# Patient Record
Sex: Female | Born: 1965
Health system: Southern US, Community
[De-identification: ages and names within clinical notes are randomized; demographics above are authoritative.]

## PROBLEM LIST (undated history)

## (undated) DIAGNOSIS — G43909 Migraine, unspecified, not intractable, without status migrainosus: Secondary | ICD-10-CM

## (undated) DIAGNOSIS — H04129 Dry eye syndrome of unspecified lacrimal gland: Secondary | ICD-10-CM

## (undated) DIAGNOSIS — M25561 Pain in right knee: Secondary | ICD-10-CM

## (undated) DIAGNOSIS — N879 Dysplasia of cervix uteri, unspecified: Secondary | ICD-10-CM

## (undated) DIAGNOSIS — M25562 Pain in left knee: Secondary | ICD-10-CM

## (undated) DIAGNOSIS — L719 Rosacea, unspecified: Secondary | ICD-10-CM

## (undated) DIAGNOSIS — R630 Anorexia: Secondary | ICD-10-CM

## (undated) HISTORY — DX: Dry eye syndrome of unspecified lacrimal gland: H04.129

## (undated) HISTORY — DX: Rosacea, unspecified: L71.9

## (undated) HISTORY — PX: KNEE SURGERY: SHX244

## (undated) HISTORY — PX: ROTATOR CUFF REPAIR: SHX139

## (undated) HISTORY — PX: DILATION AND CURETTAGE OF UTERUS: SHX78

## (undated) HISTORY — DX: Pain in left knee: M25.562

## (undated) HISTORY — PX: RHINOPLASTY: SUR1284

## (undated) HISTORY — DX: Pain in right knee: M25.561

## (undated) HISTORY — DX: Migraine, unspecified, not intractable, without status migrainosus: G43.909

## (undated) HISTORY — DX: Anorexia: R63.0

## (undated) HISTORY — PX: GYNECOLOGIC CRYOSURGERY: SHX857

## (undated) HISTORY — DX: Dysplasia of cervix uteri, unspecified: N87.9

---

## 1998-06-01 ENCOUNTER — Other Ambulatory Visit: Admission: RE | Admit: 1998-06-01 | Discharge: 1998-06-01 | Payer: Self-pay | Admitting: Gynecology

## 1999-07-29 ENCOUNTER — Other Ambulatory Visit: Admission: RE | Admit: 1999-07-29 | Discharge: 1999-07-29 | Payer: Self-pay | Admitting: Gynecology

## 1999-08-25 ENCOUNTER — Other Ambulatory Visit: Admission: RE | Admit: 1999-08-25 | Discharge: 1999-08-25 | Payer: Self-pay | Admitting: Gynecology

## 1999-09-09 HISTORY — PX: PELVIC LAPAROSCOPY: SHX162

## 1999-09-27 ENCOUNTER — Encounter (INDEPENDENT_AMBULATORY_CARE_PROVIDER_SITE_OTHER): Payer: Self-pay

## 1999-09-27 ENCOUNTER — Encounter (INDEPENDENT_AMBULATORY_CARE_PROVIDER_SITE_OTHER): Payer: Self-pay | Admitting: Specialist

## 1999-09-27 ENCOUNTER — Ambulatory Visit (HOSPITAL_COMMUNITY): Admission: RE | Admit: 1999-09-27 | Discharge: 1999-09-27 | Payer: Self-pay | Admitting: Gynecology

## 2000-08-03 ENCOUNTER — Other Ambulatory Visit: Admission: RE | Admit: 2000-08-03 | Discharge: 2000-08-03 | Payer: Self-pay | Admitting: Gynecology

## 2001-08-07 ENCOUNTER — Other Ambulatory Visit: Admission: RE | Admit: 2001-08-07 | Discharge: 2001-08-07 | Payer: Self-pay | Admitting: Gynecology

## 2003-07-01 ENCOUNTER — Encounter: Admission: RE | Admit: 2003-07-01 | Discharge: 2003-07-01 | Payer: Self-pay | Admitting: Family Medicine

## 2003-07-01 ENCOUNTER — Encounter: Payer: Self-pay | Admitting: Family Medicine

## 2003-08-24 ENCOUNTER — Other Ambulatory Visit: Admission: RE | Admit: 2003-08-24 | Discharge: 2003-08-24 | Payer: Self-pay | Admitting: Gynecology

## 2004-09-20 ENCOUNTER — Other Ambulatory Visit: Admission: RE | Admit: 2004-09-20 | Discharge: 2004-09-20 | Payer: Self-pay | Admitting: Gynecology

## 2005-09-22 ENCOUNTER — Other Ambulatory Visit: Admission: RE | Admit: 2005-09-22 | Discharge: 2005-09-22 | Payer: Self-pay | Admitting: Gynecology

## 2006-09-26 ENCOUNTER — Other Ambulatory Visit: Admission: RE | Admit: 2006-09-26 | Discharge: 2006-09-26 | Payer: Self-pay | Admitting: Gynecology

## 2007-08-07 ENCOUNTER — Ambulatory Visit (HOSPITAL_BASED_OUTPATIENT_CLINIC_OR_DEPARTMENT_OTHER): Admission: RE | Admit: 2007-08-07 | Discharge: 2007-08-07 | Payer: Self-pay | Admitting: Orthopedic Surgery

## 2007-10-09 ENCOUNTER — Other Ambulatory Visit: Admission: RE | Admit: 2007-10-09 | Discharge: 2007-10-09 | Payer: Self-pay | Admitting: Gynecology

## 2008-01-08 HISTORY — PX: HYSTEROSCOPY: SHX211

## 2008-01-10 ENCOUNTER — Encounter: Payer: Self-pay | Admitting: Gynecology

## 2008-01-10 ENCOUNTER — Ambulatory Visit (HOSPITAL_BASED_OUTPATIENT_CLINIC_OR_DEPARTMENT_OTHER): Admission: RE | Admit: 2008-01-10 | Discharge: 2008-01-10 | Payer: Self-pay | Admitting: Gynecology

## 2008-10-29 ENCOUNTER — Emergency Department (HOSPITAL_COMMUNITY): Admission: EM | Admit: 2008-10-29 | Discharge: 2008-10-29 | Payer: Self-pay | Admitting: Emergency Medicine

## 2008-12-07 ENCOUNTER — Ambulatory Visit: Payer: Self-pay | Admitting: Gynecology

## 2008-12-07 ENCOUNTER — Encounter: Payer: Self-pay | Admitting: Gynecology

## 2008-12-07 ENCOUNTER — Other Ambulatory Visit: Admission: RE | Admit: 2008-12-07 | Discharge: 2008-12-07 | Payer: Self-pay | Admitting: Gynecology

## 2009-09-22 ENCOUNTER — Ambulatory Visit: Payer: Self-pay | Admitting: Sports Medicine

## 2009-09-22 DIAGNOSIS — M25562 Pain in left knee: Secondary | ICD-10-CM

## 2009-09-22 DIAGNOSIS — M2170 Unequal limb length (acquired), unspecified site: Secondary | ICD-10-CM | POA: Insufficient documentation

## 2009-09-22 DIAGNOSIS — M25561 Pain in right knee: Secondary | ICD-10-CM | POA: Insufficient documentation

## 2009-09-22 DIAGNOSIS — M2140 Flat foot [pes planus] (acquired), unspecified foot: Secondary | ICD-10-CM | POA: Insufficient documentation

## 2009-10-20 ENCOUNTER — Ambulatory Visit: Payer: Self-pay | Admitting: Sports Medicine

## 2009-12-13 ENCOUNTER — Ambulatory Visit: Payer: Self-pay | Admitting: Gynecology

## 2009-12-13 ENCOUNTER — Other Ambulatory Visit: Admission: RE | Admit: 2009-12-13 | Discharge: 2009-12-13 | Payer: Self-pay | Admitting: Gynecology

## 2010-03-09 ENCOUNTER — Ambulatory Visit: Payer: Self-pay | Admitting: Sports Medicine

## 2010-03-23 ENCOUNTER — Encounter: Admission: RE | Admit: 2010-03-23 | Discharge: 2010-06-21 | Payer: Self-pay | Admitting: Sports Medicine

## 2010-03-23 ENCOUNTER — Encounter: Payer: Self-pay | Admitting: Sports Medicine

## 2010-04-22 ENCOUNTER — Encounter: Payer: Self-pay | Admitting: Sports Medicine

## 2010-05-02 ENCOUNTER — Ambulatory Visit: Payer: Self-pay | Admitting: Sports Medicine

## 2010-11-10 NOTE — Miscellaneous (Signed)
Summary: [facility]  [facility]   Imported By: Marily Memos 04/27/2010 10:18:51  _____________________________________________________________________  External Attachment:    Type:   Image     Comment:   External Document

## 2010-11-10 NOTE — Assessment & Plan Note (Signed)
Summary: 11:30 APPT - FU BURSITIS IN KNEES   Vital Signs:  Patient profile:   45 year old female BP sitting:   115 / 68  Vitals Entered By: Lillia Pauls CMA (March 09, 2010 12:03 PM)  History of Present Illness: Since 11/2008 has had recurrence of pes anserine bursitis bilat got somewhat better when we treated her w exercisews however when she got walking up to 15 mins then pain returns has done the exercises and these help uses a 3/4 orthotic - soft_ and that helps some as well  Allergies: No Known Drug Allergies  Physical Exam  General:  Well-developed,well-nourished,in no acute distress; alert,appropriate and cooperative throughout examination Msk:  TTP over pes anserine area bilat RT> LTMM good hip ROM now has norm strength of both hip abductors both sartorius MM are strong HS are strong slt weakness over RT adductor  gait looks normal for walking   Impression & Recommendations:  Problem # 1:  PES ANSERINUS TENDINITIS OR BURSITIS (ICD-726.61) with chronic problems will refer to PT  trial of inotophoresis give a good exerse program p evaluation  reck P 8 sessions  Problem # 2:  UNEQUAL LEG LENGTH (ICD-736.81) this is small difference  however she does have pronatedd gait without orthotics  will bring back for full length custom orthotics  Complete Medication List: 1)  Voltaren 1 % Gel (Diclofenac sodium) .... Apply 1 gram to the affected areas qid

## 2010-11-10 NOTE — Miscellaneous (Signed)
Summary: [facility]  [facility]   Imported By: Marily Memos 03/25/2010 10:12:49  _____________________________________________________________________  External Attachment:    Type:   Image     Comment:   External Document

## 2010-11-10 NOTE — Assessment & Plan Note (Signed)
Summary: FU KNEE ISSUES/MJD   Vital Signs:  Patient profile:   45 year old female BP sitting:   96 / 62  Vitals Entered By: Lillia Pauls CMA (October 20, 2009 8:47 AM)  History of Present Illness: Reports to f/u chronic bilateral pes anserine bursitis. Notes 50% improvement since LOV. Performing hip/sartorius exercises daily w/o difficulty. Notes increased strength. Applying voltaren gel as prescribed. No knee swelling/instability. Plans to start walking soon. No concerns.  Happy with progress.  Allergies: No Known Drug Allergies  Physical Exam  General:  Well-developed,well-nourished,in no acute distress; alert,appropriate and cooperative throughout examination Msk:  HIPS: FROM. Increased ER strength since LOV.  KNEES: Decreased pes bursa ttp bilaterally. Trace pes bursa swelling bilaterally. Increased sartorius strength. No discoloration.  GAIT: Pronation controlled with orthotics.   Impression & Recommendations:  Problem # 1:  PES ANSERINUS TENDINITIS OR BURSITIS (ICD-726.61)  - Voltaren as needed. - Continue daily exercises. Can decreased exercise frequency to 3 times weekly as condition improves. - Continue use of current orthotics. - Daily walking as tolerated. Avoid uneven surfaces initially. Gradually transition towards jogging as tolerated. - RTC as needed for questions or concerns.  Problem # 2:  PES PLANUS (ICD-734)  - Continue use of current orthotics. - RTC as needed. - Will modify current orthotics or construct a new pair if patient continues to have knee pain or develops foot/ankle pain.  Problem # 3:  UNEQUAL LEG LENGTH (ICD-736.81)  - Continue use of orthotics with foam pad added to base of left orthotic. - RTC as needed.  Complete Medication List: 1)  Voltaren 1 % Gel (Diclofenac sodium) .... Apply 1 gram to the affected areas qid

## 2010-11-10 NOTE — Assessment & Plan Note (Signed)
Summary: FU KNEE POST PT VISITS/MJD   Vital Signs:  Patient profile:   45 year old female Height:      64 inches BP sitting:   116 / 70  (right arm) Cuff size:   regular  Vitals Entered By: Tessie Fass CMA (May 02, 2010 10:10 AM) CC: F/U bilateal knee   CC:  F/U bilateal knee.  History of Present Illness: Kaliann did  well w PT does exercise faithfully note that her pain level is down a lot able to walk up to 20 mins straight at 3.4 MPH cycling no prob exercises in gym well tolerated  note iontophoresis caused a rash bilat  Allergies: No Known Drug Allergies  Physical Exam  General:  Well-developed,well-nourished,in no acute distress; alert,appropriate and cooperative throughout examination Msk:  some skin irritation over both pes anserine areas  strength is superb on abduction hip rotation HS quad sartorius  this is improved  mild TTP over pes bilat =- thickened but not swollen   Impression & Recommendations:  Problem # 1:  PES ANSERINUS TENDINITIS OR BURSITIS (ICD-726.61) good response to PT keep ongoing exercise plan grad inc walking  reck prn  Complete Medication List: 1)  Voltaren 1 % Gel (Diclofenac sodium) .... Apply 1 gram to the affected areas qid  Patient Instructions: 1)  Try to pick 5 exercises that help and do these at least 3 times per week 2)  On other days substitute some of the other exercises you have tired 3)  walking - keep 20 mins for 2 weeks before increasing  4)  then increase no more than 5 mins per week 5)  very good to have some biking to counter walking 6)  as needed return

## 2010-11-10 NOTE — Letter (Signed)
Summary: [facility]  [facility]   Imported By: Marily Memos 03/09/2010 16:16:20  _____________________________________________________________________  External Attachment:    Type:   Image     Comment:   External Document

## 2011-02-21 NOTE — Op Note (Signed)
[name], [name]              ACCOUNT NO.:  [id number]   MEDICAL RECORD NO.:  [id number]          PATIENT TYPE:  AMB   LOCATION:  [facility]                         FACILITY:  [facility]   PHYSICIAN:  Ollen Gross, M.D.    DATE OF BIRTH:  Feb 18, 1966   DATE OF PROCEDURE:  08/07/2007  DATE OF DISCHARGE:                               OPERATIVE REPORT   PREOPERATIVE DIAGNOSIS:  Left knee medial meniscal tear.   POSTOPERATIVE DIAGNOSIS:  Left knee medial meniscal tear.   PROCEDURE:  Left knee arthroscopy with meniscal debridement.   SURGEON:  Dr. Homero Fellers Aluisio.   ASSISTANT:  None.   ANESTHESIA:  General.   ESTIMATED BLOOD LOSS:  Minimal.   DRAINS:  None.   COMPLICATIONS:  None.   CONDITION:  Stable to recovery.   CLINICAL NOTE:  Katherine Wolfe is a 45 year old female who injured her left  knee while running this past summer.  She has had progressively  worsening pain and mechanical symptoms.  Exam and history suggested  medial meniscal tear confirmed by MRI.  She presents now for arthroscopy  and debridement.   PROCEDURE IN DETAIL:  After successful administration of general  anesthetic, a tourniquet was placed on the left thigh and left lower  extremity prepped and draped in the usual sterile fashion. Standard  superomedial and inferolateral portals were created, inflow cannula  passed superomedial, camera passed inferolateral.  Arthroscopic  visualization proceeds.  The undersurface of the patella and the  trochlea looked normal.  Medial and lateral gutters were visualized and  there were no loose bodies.  Flexion and valgus force was applied to the  knee and the medial compartment is entered.  There is a tear of the body  in the posterior horn of the medial meniscus.  A spinal needle was used  to localize the inferomedial portal, small incision made, dilator placed  and probe placed.  The meniscus is unstable.  It was debrided back to a  stable base with baskets and a 4.2 mm shaver  and sealed off with the  ArthroCare.  The femoral condyle and tibial plateau looked normal.  The  intercondylar notch was visualized, the ACL looks normal.  The lateral  compartment is entered and it is normal.  I again inspected the joint,  there were no further tears, defects or loose bodies.  The arthroscopic  equipment was then removed from the inferior portals which  were closed with interrupted 4-0 nylon.  20 mL of 0.25% Marcaine with  epi.  I then injected through the inflow cannula and that is removed and  that portal closed with nylon.  A bulky sterile dressing applied was  applied and she was awakened and transported to recovery in stable  condition.      Ollen Gross, M.D.  Electronically Signed     FA/MEDQ  D:  08/07/2007  T:  08/08/2007  Job:  [id number]

## 2011-02-21 NOTE — H&P (Signed)
NAMEDAIYA, TAMER              ACCOUNT NO.:  [id number]   MEDICAL RECORD NO.:  [id number]          PATIENT TYPE:  AMB   LOCATION:  [facility]                         FACILITY:  [facility]   PHYSICIAN:  Timothy P. Fontaine, M.D.DATE OF BIRTH:  Oct 28, 1965   DATE OF ADMISSION:  DATE OF DISCHARGE:                              HISTORY & PHYSICAL   CHIEF COMPLAINT:  Menorrhagia.   HISTORY OF PRESENT ILLNESS:  A 45 year old G0 presents complaining of  worsening heavier menses.  Outpatient evaluation included a CBC which  showed a hemoglobin of 13, a normal TSH and a sono histogram which  showed an echogenic defect within the cavity measuring 11.7 mm.  She is  admitted for hysteroscopy, D&C, resection of endometrial cavity defects.   PAST MEDICAL HISTORY:  Significant for asthma, rosacea.   CURRENT MEDICATIONS:  1. Multivitamins.  2. Metro lotion.  3. Albuterol and Flovent.  4. Epipen p.r.n.  5. Multivitamin.   PAST SURGICAL HISTORY:  1. Rhinoplasty.  2. Arthroscopy for meniscus tear.  3. Laparoscopy for ovarian fibroma.  4. A cyst removal from the eye.   ALLERGIES:  NUTS.   MEDICATIONS:  None.   REVIEW OF SYSTEMS:  Noncontributory.   FAMILY HISTORY:  Noncontributory.   SOCIAL HISTORY:  Noncontributory.   PHYSICAL EXAM:  Afebrile.  Vital signs were stable.  HEENT: Normal.  LUNGS:  Clear.  CARDIAC:  Regular rate.  No rubs, murmurs or gallops.  ABDOMEN:  Benign.  PELVIC:  External BUS and vagina normal.  Cervix normal.  Uterus normal  size, midline, mobile, nontender.  Adnexa without masses or tenderness.   ASSESSMENT:  A 45 year old G0, worsening menses.  Ultrasound shows an  echogenic endometrial cavity defect, questionable polyp versus small  myoma for hysteroscopic evaluation, resection and D&C.  I reviewed with  the patient the proposed surgery, hysteroscopy, D&C and resection. I  reviewed the expected intraoperative and postoperative courses.  Instrumentation, use of the  resectoscope, D&C were reviewed.  The  patient understands there are no guarantees as far as menorrhagia is  concerned, that her bleeding may continue, worsen or change following  the procedure.  The risk of infection, hemorrhage necessitating  transfusion and the risks of transfusion, uterine perforation, damage to  internal organs either immediately or delay recognized including  injuries to bowel, bladder, ureters, vessels and nerves necessitating  major exploratory reparative surgeries, future reparative surgeries,  ostomy formation, bowel resection, bladder repair all discussed,  understood and accepted..  The risk of distended media absorption  leading to metabolic complications, seizures was discussed, understood  and accepted.  The patient's questions were answered to her  satisfaction.  She is ready to proceed with surgery.      Timothy P. Fontaine, M.D.  Electronically Signed     TPF/MEDQ  D:  01/06/2008  T:  01/06/2008  Job:  [id number]

## 2011-02-21 NOTE — Op Note (Signed)
[name], Katherine Wolfe              ACCOUNT NO.:  [id number]   MEDICAL RECORD NO.:  [id number]          PATIENT TYPE:  AMB   LOCATION:  [facility]                         [facility]ACILITY:  [facility]   PHYSICIAN:  Timothy P. Fontaine, M.D.DATE OF BIRTH:  1966/01/17   DATE OF PROCEDURE:  01/10/2008  DATE OF DISCHARGE:                               OPERATIVE REPORT   PREOPERATIVE DIAGNOSES:  1. Menorrhagia.  2. Endometrial polyp.   POSTOPERATIVE DIAGNOSES:  1. Menorrhagia.  2. Endometrial polyp.   PROCEDURE:  Hysteroscopy D&C.   SURGEON:  Dr. Colin Broach.   ANESTHETIC:  General with 1% lidocaine paracervical block.   ESTIMATED BLOOD LOSS:  Minimal.   COMPLICATIONS:  None.   SORBITOL DISCREPANCY:  70 mL.   SPECIMEN:  1. Endometrial curetting.  2. Lower uterine segment polyp.   FINDINGS:  EUA external BUS vagina normal.  Cervix normal.  Bimanual  uterus normal size, midline and mobile.  Adnexa without masses.  Hysteroscopic was adequate with an arcuate cavity undulating polypoidal  endometrium.  No distinctive  polyps noted. Sharp curettage performed  with good emptying of the cavity post D&C hysteroscopy showed good  distention, no evidence of perforation with an empty cavity.  The  hysteroscopy was adequate noting right and left tubal ostia visualized,  anterior and posterior surfaces, lower uterine segment and endocervical  canal all visualized.   PROCEDURE:  The patient was taken to the operating room, underwent  general anesthesia and was placed in the low dorsal lithotomy position,  received a perineal vaginal preparation with Betadine solution, bladder  emptied with in-and-out Foley catheterization per nursing personnel.  The patient draped in the usual fashion.  EUA performed.  The cervix was  visualized with a speculum, anterior lip grasped with a single-tooth  tenaculum and a paracervical block using 1% lidocaine was placed a total  of 10 mL.  The cervix was gently and  gradually dilated to admit the  operative hysteroscope and hysteroscopy was performed with findings  noted above.  There was no distinctive polyps to remove but a very  undulating abundant endometrial pattern and a sharp curettage was  performed emptying the uterus.  Rehysteroscopy showed an empty cavity,  good distention, no evidence of perforation.  She did have a tufting  polyp appearing area in the lower uterine segment beginning of the  endocervical canal and this was excised  separately and sent to pathology.  The instruments were then removed,  tenaculum removed.  Silver nitrate applied to the tenaculum sites to  achieve ultimate hemostasis.  The patient was placed in supine position,  received Toradol per anesthesia and  was taken to the recovery room in  good condition having tolerated the procedure well.      Timothy P. Fontaine, M.D.  Electronically Signed     TPF/MEDQ  D:  01/10/2008  T:  01/10/2008  Job:  [id number]

## 2011-03-03 ENCOUNTER — Other Ambulatory Visit (HOSPITAL_COMMUNITY)
Admission: RE | Admit: 2011-03-03 | Discharge: 2011-03-03 | Disposition: A | Discharge status: Home / Self Care | Payer: BC Managed Care – PPO | Source: Ambulatory Visit | Attending: Gynecology | Admitting: Gynecology

## 2011-03-03 ENCOUNTER — Other Ambulatory Visit: Payer: Self-pay | Admitting: Gynecology

## 2011-03-03 ENCOUNTER — Encounter (INDEPENDENT_AMBULATORY_CARE_PROVIDER_SITE_OTHER): Payer: BC Managed Care – PPO | Admitting: Gynecology

## 2011-03-03 DIAGNOSIS — Z833 Family history of diabetes mellitus: Secondary | ICD-10-CM

## 2011-03-03 DIAGNOSIS — Z13220 Encounter for screening for lipoid disorders: Secondary | ICD-10-CM

## 2011-03-03 DIAGNOSIS — Z124 Encounter for screening for malignant neoplasm of cervix: Secondary | ICD-10-CM | POA: Insufficient documentation

## 2011-03-03 DIAGNOSIS — R82998 Other abnormal findings in urine: Secondary | ICD-10-CM

## 2011-03-03 DIAGNOSIS — Z01419 Encounter for gynecological examination (general) (routine) without abnormal findings: Secondary | ICD-10-CM

## 2011-05-29 ENCOUNTER — Telehealth: Payer: Self-pay | Admitting: *Deleted

## 2011-05-29 NOTE — Telephone Encounter (Signed)
PT CALLED WANTING COPY OF LAB RESULT FAXED TO HER AT [phone]. RECENT FAXED TO PT.

## 2011-07-19 LAB — POCT HEMOGLOBIN-HEMACUE: Operator id: 28088

## 2011-08-10 DIAGNOSIS — M25561 Pain in right knee: Secondary | ICD-10-CM

## 2011-08-10 DIAGNOSIS — M25562 Pain in left knee: Secondary | ICD-10-CM

## 2011-08-10 HISTORY — DX: Pain in right knee: M25.561

## 2011-08-10 HISTORY — DX: Pain in left knee: M25.562

## 2011-08-16 ENCOUNTER — Ambulatory Visit (INDEPENDENT_AMBULATORY_CARE_PROVIDER_SITE_OTHER): Payer: BC Managed Care – PPO | Admitting: Sports Medicine

## 2011-08-16 VITALS — BP 90/62 | Ht 64.0 in | Wt 125.0 lb

## 2011-08-16 DIAGNOSIS — M25561 Pain in right knee: Secondary | ICD-10-CM

## 2011-08-16 DIAGNOSIS — M25569 Pain in unspecified knee: Secondary | ICD-10-CM

## 2011-08-16 DIAGNOSIS — Q796 Ehlers-Danlos syndrome, unspecified: Secondary | ICD-10-CM

## 2011-08-16 DIAGNOSIS — IMO0002 Reserved for concepts with insufficient information to code with codable children: Secondary | ICD-10-CM

## 2011-08-16 DIAGNOSIS — M357 Hypermobility syndrome: Secondary | ICD-10-CM

## 2011-08-16 DIAGNOSIS — M25562 Pain in left knee: Secondary | ICD-10-CM

## 2011-08-16 NOTE — Assessment & Plan Note (Signed)
She is getting recurrent bursal swellings around the knee joints bilaterally from the hypermobility with excess lateral motion and with hyperextension of both knees  Continue on her strength program Avoid excess stretching yoga or hyperextension of joint use a compression sleeve to help control swelling in both knees

## 2011-08-16 NOTE — Patient Instructions (Signed)
Benign Hypermobility syndrome - Beighton score  Use compression for knee when walking Continue strength exercises Avoid maximal stretch positions  After exercise with swelling - ice   We need to schedule and make custom orthotics with more arch

## 2011-08-16 NOTE — Assessment & Plan Note (Addendum)
This is significant and probably relates to why she is getting recurrent injuries around her knees  This affects her gait with some arch collapse  She currently is using some custom orthotics and over the shelf orthotics it did help control pronation but they're not that effective when evaluated today  We will reschedule her for custom orthotics  I think that if we're able to improve her gait is less and the swelling around her knee she may be able to get back into a walking program  Time spent 50 minutes

## 2011-08-16 NOTE — Progress Notes (Signed)
  Subjective:    Patient ID: Katherine Wolfe, female    DOB: 20-Mar-1966, 45 y.o.   MRN: [id number]  HPI Hx of bilat pes anserine bursitis Last seen 04/2010 Does cardio 4 x week/ yoga and pilates and some weights Uses voltaren cream  In spite of her high level of fitness and her ability to do fairly long workouts at the gymnasium or with yoga she gets knee pain with walking more than 20 minutes.  Recently she has noted swelling over both pes ansserine bursas again with the left being the worst. On the left side there is also a lot of swelling over her fibular head.  Review of Systems     Objective:   Physical Exam  NAD  Can do full split/ some right adductor pain Full range of motion both hips Excellent strength quadriceps, hip abductors, hip adductors, hip rotators, sartorius  Palpable swelling and tenderness over the pes anserine left moderate right mild  Palpable swelling over left fibular head  Bilateral hyperextension of the knees with very lax Lachman and collateral ligament testing although she has good endpoints Thumb 2 wrist bilaterally/  left MCP joint extends past 90 Palms to floor  walking gait shows a lot of horizontal mobility of both knees Arches show moderate pronation bilaterally  MSK [place] Quadriceps and patellar tendons are within normal limits bilaterally No sign of effusions Menisci appeared normal bilaterally There is swelling in the pes anserine bursa on the left there is moderate and on the right it is mild  There is a bursal type swelling around the left fibular head       Assessment & Plan:

## 2011-08-23 ENCOUNTER — Other Ambulatory Visit: Payer: Self-pay | Admitting: *Deleted

## 2011-08-23 ENCOUNTER — Encounter: Payer: Self-pay | Admitting: Gynecology

## 2011-08-23 DIAGNOSIS — R921 Mammographic calcification found on diagnostic imaging of breast: Secondary | ICD-10-CM

## 2011-08-24 ENCOUNTER — Other Ambulatory Visit: Payer: Self-pay | Admitting: Gynecology

## 2011-08-24 DIAGNOSIS — R921 Mammographic calcification found on diagnostic imaging of breast: Secondary | ICD-10-CM

## 2011-08-29 ENCOUNTER — Ambulatory Visit: Payer: BC Managed Care – PPO | Admitting: Sports Medicine

## 2011-09-07 ENCOUNTER — Ambulatory Visit (INDEPENDENT_AMBULATORY_CARE_PROVIDER_SITE_OTHER): Payer: BC Managed Care – PPO | Admitting: Sports Medicine

## 2011-09-07 VITALS — BP 92/58

## 2011-09-07 DIAGNOSIS — M357 Hypermobility syndrome: Secondary | ICD-10-CM

## 2011-09-07 DIAGNOSIS — M2170 Unequal limb length (acquired), unspecified site: Secondary | ICD-10-CM

## 2011-09-07 DIAGNOSIS — M2140 Flat foot [pes planus] (acquired), unspecified foot: Secondary | ICD-10-CM

## 2011-09-07 DIAGNOSIS — Q796 Ehlers-Danlos syndrome, unspecified: Secondary | ICD-10-CM

## 2011-09-07 DIAGNOSIS — R269 Unspecified abnormalities of gait and mobility: Secondary | ICD-10-CM

## 2011-09-07 DIAGNOSIS — IMO0002 Reserved for concepts with insufficient information to code with codable children: Secondary | ICD-10-CM

## 2011-09-07 NOTE — Assessment & Plan Note (Signed)
On completion of orthotics she has less dropped of her left shoulder and less Trendelenburg.  The horizontal motion of the patella bilaterally persist but is not as extreme.  Pronation is well controlled and she feels more comfortable.  We will recheck this in 3-4 months.

## 2011-09-07 NOTE — Progress Notes (Signed)
  Subjective:    Patient ID: Katherine Wolfe, female    DOB: June 27, 1966, 45 y.o.   MRN: [id number]  HPI  Katherine Wolfe is a pleasant female patient coming for orthotics. She has had a problem with recurrent pes anserive bursitis that appears to be related to excess motion at her knees. She has hypermobility syndrome and with walking gait she gets horizontal translation of knees bilat. She has older orthotics which helped correct an unequal leg length and they did improve her walking somewhat. However they seem not to be a supportive after having them for several years and she is getting symptoms within 20 minutes of walking.   Review of Systems     Objective:   Physical Exam   no acute distress Hypermobility as noted Genu valgus of both knees that is mild but increases somewhat with standing Walking gait reveals that both knees translate rapidly with horizontal motion There is puffiness at the pes anserine bilaterally but not as tender today Some puffiness over the knee joint capsule on the left      Assessment & Plan:   1. Pes anserinus tendinitis or bursitis   2. Benign hypermobility syndrome   3. PES PLANUS    Patient was fitted for a : standard, cushioned, semi-rigid orthotic. The orthotic was heated and afterward the patient stood on the orthotic blank positioned on the orthotic stand. The patient was positioned in subtalar neutral position and 10 degrees of ankle dorsiflexion in a weight bearing stance. After completion of molding, a stable base was applied to the orthotic blank. The blank was ground to a stable position for weight bearing. Size: 8 Red cambray Base:blue EVA medium Posting:to partially correct leg length difference Additional orthotic padding:

## 2011-09-07 NOTE — Assessment & Plan Note (Signed)
Small correction made to the base of the orthotics

## 2011-09-07 NOTE — Assessment & Plan Note (Signed)
She does get less symptoms when she wears the compression sleeves and will continue doing this  We want to see if the orthotics help lessen her symptoms as well.

## 2011-10-12 ENCOUNTER — Ambulatory Visit (INDEPENDENT_AMBULATORY_CARE_PROVIDER_SITE_OTHER): Payer: BC Managed Care – PPO | Admitting: Sports Medicine

## 2011-10-12 VITALS — BP 100/60

## 2011-10-12 DIAGNOSIS — Q796 Ehlers-Danlos syndrome, unspecified: Secondary | ICD-10-CM

## 2011-10-12 DIAGNOSIS — M25569 Pain in unspecified knee: Secondary | ICD-10-CM

## 2011-10-12 DIAGNOSIS — M357 Hypermobility syndrome: Secondary | ICD-10-CM

## 2011-10-12 DIAGNOSIS — IMO0002 Reserved for concepts with insufficient information to code with codable children: Secondary | ICD-10-CM

## 2011-10-12 MED ORDER — AMITRIPTYLINE HCL 25 MG PO TABS: 25.0000 mg | ORAL_TABLET | Freq: Every day | ORAL | Status: DC

## 2011-10-12 MED ORDER — MELOXICAM 15 MG PO TABS: 15.0000 mg | ORAL_TABLET | Freq: Every day | ORAL | Status: DC

## 2011-10-12 NOTE — Progress Notes (Signed)
  Subjective:    Patient ID: Katherine Wolfe, female    DOB: 07-30-1966, 46 y.o.   MRN: [id number]  HPI  Pt presents to clinic for f/u of lt knee pain which has been worse for the last week. Walked outside on Christmas day at a faster pace than was comfortable for her, has had increased pain and left lateral knee swelling since then.  Taking ibuprofen every 6 hours. Wearing bodyhelix knee sleeves for activity- has been helpful. Getting dorsal foot numbness bilaterally.  Her pes anserine bursa had been swelling less Lately she has had more swelling on the lateral aspect particularly of her left leg Now she gets pain even with biking on the left leg  She had gotten up to walking 45 minutes on the treadmill before she did be faster walking outside and the pain flared    Review of Systems     Objective:   Physical Exam nad  Mild hyperextension knees on sit home test She can get flexion of both knees to her buttocks Some generalized ligamentous laxity Puffiness over lt fib head more than rt  Less puffiness over pes anserine bilat Full flexion, extension bilat knees  She has significant tenderness to palpation over the pes anserine on the right and left She is nontender over the right fibular head but it is slightly puffy She is tender over the left fibular head and there is an area of puffiness and soft tissue swelling      Assessment & Plan:

## 2011-10-12 NOTE — Assessment & Plan Note (Signed)
I want to refer her for a Pilates private lesson to see if she can strengthen and stabilize her knees without getting excess motion from her hypermobility

## 2011-10-12 NOTE — Patient Instructions (Addendum)
Start prednisone 20 mg - 1 tablet twice daily for 7 days for your swelling  Start meloxicam once daily after you finished the prednisone  Start amitriptyline 25 mg at bedtime to help with foot numbness  It is ok to do activities that do not make your knees more painful and swollen   Please follow up in 3 weeks   Contact Nicholaus Bloom for pilates instruction at [employer] [phone]   Thank you for seeing [place] today!

## 2011-10-12 NOTE — Assessment & Plan Note (Signed)
She has developed a significant flare of her bursitis and even hands of a pseudo-bursa around the left fibular head that was noted on her last ultrasound scan  Plan to use a prednisone dose of 20 mg twice a day x7 days  At completion of this start Mobic 15 mg daily  Check a sedimentation rate, CBC, rheumatoid factor and ANA  Recheck in 3 weeks

## 2011-10-13 ENCOUNTER — Other Ambulatory Visit: Payer: BC Managed Care – PPO

## 2011-10-13 DIAGNOSIS — M25569 Pain in unspecified knee: Secondary | ICD-10-CM

## 2011-10-13 LAB — CBC
MCV: 94.5 fL (ref 78.0–100.0)
Platelets: 336 10*3/uL (ref 150–400)
RDW: 13.1 % (ref 11.5–15.5)
WBC: 13.4 10*3/uL — ABNORMAL HIGH (ref 4.0–10.5)

## 2011-10-13 NOTE — Progress Notes (Signed)
RF,ANA,ESR AND CBC DONE TODAY Katherine Wolfe

## 2011-10-14 LAB — RHEUMATOID FACTOR: Rhuematoid fact SerPl-aCnc: <10 IU/mL (ref <=14)

## 2011-10-14 LAB — SEDIMENTATION RATE: Sed Rate: 1 mm/hr (ref 0–22)

## 2011-11-02 ENCOUNTER — Ambulatory Visit (INDEPENDENT_AMBULATORY_CARE_PROVIDER_SITE_OTHER): Payer: BC Managed Care – PPO | Admitting: Sports Medicine

## 2011-11-02 VITALS — BP 102/60

## 2011-11-02 DIAGNOSIS — I7300 Raynaud's syndrome without gangrene: Secondary | ICD-10-CM

## 2011-11-02 DIAGNOSIS — M357 Hypermobility syndrome: Secondary | ICD-10-CM

## 2011-11-02 DIAGNOSIS — IMO0002 Reserved for concepts with insufficient information to code with codable children: Secondary | ICD-10-CM

## 2011-11-02 DIAGNOSIS — Q796 Ehlers-Danlos syndrome, unspecified: Secondary | ICD-10-CM

## 2011-11-02 MED ORDER — FLUOXETINE HCL 20 MG PO TABS: 20.0000 mg | ORAL_TABLET | Freq: Every day | ORAL | Status: DC

## 2011-11-02 NOTE — Assessment & Plan Note (Signed)
Somewhat improved with prednisone, mobic, voltaren gel.  Will continue these as well as knee compression sleeves.

## 2011-11-02 NOTE — Progress Notes (Signed)
  Subjective:    Patient ID: Katherine Wolfe, female    DOB: 05-Nov-1965, 46 y.o.   MRN: [id number]  HPI  Cleaster comes in for follow up of her bilateral knee pain.  She says her pain is doing a little better- but only because she is not exercising as much.  She took prednisone as prescribed for last flare of her pas anserine bursitis, which helped a little.  She says that after she stopped taking the prednisone she had some erythema and weeping over the bursa on her left knee.  She is also having pain and swelling on the lateral side of her knees over the head of the fibula. She says she has only been riding the bike some, and avoids any activity on her knees, and that she cannot walk or run for exercise.    She has raynaud's syndrome that affect both her hands and her feet, causing pain and numbness.  She states it is worse with cold weather, so she is unable to tell if amitriptyline has affected it.  She says she does have a painful sore on one of her toes.    Review of Systems Pertinent items in HPI.     Objective:   Physical Exam BP 102/60 General appearance: alert, cooperative and no distress Knee: Left knee with mild swelling at pes anserine bursa, erythema over that area, and mild swelling over fibula head. Right knee with mild swelling over pes anserine bursa and fibula head. ROM normal in flexion and extension and lower leg rotation Bilateral . Pt has some increased ligament laxity throughout her bilateral knees but consistent endpoints including ACL, PCL, LCL, MCL. Negative Mcmurray's and provocative meniscal tests bilaterally. Non painful patellar compression in right and left knee. Patellar and quadriceps tendons unremarkable bilaterally  Feet: Pt has small swelling on bottom of second right toe.   There is a patch of dried skin with some erythema over the pedis anserine area of her left leg      Assessment & Plan:

## 2011-11-02 NOTE — Assessment & Plan Note (Signed)
Continue knee braces and custom orthotics.  Again, will refer to [facility] for evaluation.

## 2011-11-02 NOTE — Patient Instructions (Addendum)
Please stop taking Amitriptyline.  Please start taking fluoxetine, this may help with your pain without affecting your blood vessels.    We will order a consult for you to see a Rheumatologist for evaluation for a collagen vascular disease.    Dr. Kellie Simmering [address] [place] Monday, February 11th @ 10:30 Phone number is [phone] Fax number is [phone]

## 2011-11-02 NOTE — Assessment & Plan Note (Addendum)
Hypermobility, vascular changes, and persistent bursitis concerning for collagen vascular disorder.  However, ANA, Sed rate, and RF normal.  Will d/c amitriptyline due to concern for vasoconstriction side effects, and will start fluoxetine for both treatment of raynaud and pain. Will refer to Rheumatology for evaluaion.

## 2011-11-13 ENCOUNTER — Telehealth: Payer: Self-pay | Admitting: *Deleted

## 2011-11-13 NOTE — Telephone Encounter (Signed)
Message copied by Mora Bellman on Mon Nov 13, 2011  2:48 PM ------      Message from: Enid Baas      Created: Thu Nov 09, 2011  3:53 PM      Regarding: FW: phone message       I suggested to Donnelle that we would like to get the opinion of a rheumatologist.  Dr Lendon Colonel is the one recommended by Dr Uvaldo Rising who is her PCP.  Ask Aiyana if this would be OK and we will send a referral letter.      ----- Message -----         From: Lizbeth Bark         Sent: 11/08/2011   9:40 AM           To: Enid Baas, MD      Subject: phone message                                            Dr. Uvaldo Rising would like you to know that Dlisa would do well with Dr. Lendon Colonel at [facility]   Fax-[phone]

## 2011-11-13 NOTE — Telephone Encounter (Signed)
Left pt a message 11/10/11, and 11/13/11 to return my call.

## 2011-11-17 NOTE — Telephone Encounter (Signed)
Spoke with pt- she states she already has appt with Dr. Kellie Simmering for 11/20/11, and she would like to keep this appt.

## 2011-12-27 ENCOUNTER — Encounter: Payer: Self-pay | Admitting: Sports Medicine

## 2011-12-27 ENCOUNTER — Ambulatory Visit (INDEPENDENT_AMBULATORY_CARE_PROVIDER_SITE_OTHER): Payer: BC Managed Care – PPO | Admitting: Sports Medicine

## 2011-12-27 VITALS — BP 105/71 | HR 59

## 2011-12-27 DIAGNOSIS — IMO0002 Reserved for concepts with insufficient information to code with codable children: Secondary | ICD-10-CM

## 2011-12-27 DIAGNOSIS — M25569 Pain in unspecified knee: Secondary | ICD-10-CM

## 2011-12-27 DIAGNOSIS — M25562 Pain in left knee: Secondary | ICD-10-CM | POA: Insufficient documentation

## 2011-12-27 MED ORDER — GABAPENTIN 300 MG PO CAPS: ORAL_CAPSULE | ORAL | Status: DC

## 2011-12-27 NOTE — Assessment & Plan Note (Addendum)
This is also fairly tender today, she has had injections into this region previously. I think a knee sleeve along with some therapy will help. The ultimate goal for this patient with hypermobility is intensive strengthening of the stabilizing musculature. Is not better I would place her back into formal physical therapy.

## 2011-12-27 NOTE — Patient Instructions (Signed)
Added neurontin. Cont sleeve. Cont home exercises. Cont orthotics. Cont meloxicam. See [place] in 6 weeks.  Drs. Shantal Roan and [facility].

## 2011-12-27 NOTE — Progress Notes (Signed)
  Subjective:    Patient ID: Katherine Wolfe, female    DOB: 13-Aug-1966, 46 y.o.   MRN: [id number]  HPI Linnette comes in with complaints of left and right knee pain. Left worse than right. She was seen here previously, for bilateral pes anserine bursitis, and has been wearing a body helix knee sleeve. She was sent to the rheumatologist for suspicion of collagen vascular disease, blood work was negative. The rheumatologist did inject her left knee twice for suspected meniscal injury, this improved her pain temporarily. Overall she localizes the pain mostly on the lateral aspect, she does note some fullness in this area as well. She is currently using meloxicam which does help. She denies any mechanical symptoms at this point. She also notes a burning and numbness type sensation running down the lateral aspect of her left lower leg and into her foot.  Able to walk only 15 mintutes before becomes painful Swimming OK   Review of Systems    No fevers, chills, night sweats, weight loss, chest pain, or shortness of breath.  Social History: Non-smoker.  Objective:   Physical Exam  General:  Well developed, well nourished, and in no acute distress. Neuro:  Alert and oriented x3, extra-ocular muscles intact. Skin: Warm and dry, no rashes noted. Respiratory:  Not using accessory muscles, speaking in full sentences. Musculoskeletal:  Left knee: Full range of motion, good strength, tender to palpation along the medial joint line, pes anserine bursa, as well as over the fibular head. Strength, sensation, reflexes are all intact distally.LCL, MCL, PCL, anterior cruciate ligament are all intact, negative McMurray's.      Assessment & Plan:

## 2011-12-27 NOTE — Assessment & Plan Note (Signed)
Now with pain running down lateral lower leg, numbness. Suspect some common peroneal nerve entrapment. Will start neurontin up-taper. Cont sleeve. [facility]. Cont meloxicam. Consider hydrodissection if no better in 6 weeks.

## 2012-01-24 ENCOUNTER — Ambulatory Visit: Payer: BC Managed Care – PPO | Attending: Sports Medicine

## 2012-01-24 DIAGNOSIS — IMO0001 Reserved for inherently not codable concepts without codable children: Secondary | ICD-10-CM | POA: Insufficient documentation

## 2012-01-24 DIAGNOSIS — R262 Difficulty in walking, not elsewhere classified: Secondary | ICD-10-CM | POA: Insufficient documentation

## 2012-01-24 DIAGNOSIS — M25569 Pain in unspecified knee: Secondary | ICD-10-CM | POA: Insufficient documentation

## 2012-01-30 ENCOUNTER — Ambulatory Visit: Payer: BC Managed Care – PPO

## 2012-02-01 ENCOUNTER — Ambulatory Visit: Payer: BC Managed Care – PPO

## 2012-02-06 ENCOUNTER — Ambulatory Visit: Payer: BC Managed Care – PPO

## 2012-02-07 ENCOUNTER — Ambulatory Visit (INDEPENDENT_AMBULATORY_CARE_PROVIDER_SITE_OTHER): Payer: BC Managed Care – PPO | Admitting: Sports Medicine

## 2012-02-07 ENCOUNTER — Other Ambulatory Visit: Payer: Self-pay | Admitting: *Deleted

## 2012-02-07 ENCOUNTER — Ambulatory Visit: Payer: BC Managed Care – PPO | Attending: Sports Medicine

## 2012-02-07 ENCOUNTER — Encounter: Payer: Self-pay | Admitting: Sports Medicine

## 2012-02-07 VITALS — BP 107/69 | HR 76

## 2012-02-07 DIAGNOSIS — M25569 Pain in unspecified knee: Secondary | ICD-10-CM | POA: Insufficient documentation

## 2012-02-07 DIAGNOSIS — R262 Difficulty in walking, not elsewhere classified: Secondary | ICD-10-CM | POA: Insufficient documentation

## 2012-02-07 DIAGNOSIS — IMO0002 Reserved for concepts with insufficient information to code with codable children: Secondary | ICD-10-CM

## 2012-02-07 DIAGNOSIS — R921 Mammographic calcification found on diagnostic imaging of breast: Secondary | ICD-10-CM

## 2012-02-07 DIAGNOSIS — IMO0001 Reserved for inherently not codable concepts without codable children: Secondary | ICD-10-CM | POA: Insufficient documentation

## 2012-02-07 DIAGNOSIS — Q796 Ehlers-Danlos syndrome, unspecified: Secondary | ICD-10-CM

## 2012-02-07 DIAGNOSIS — M357 Hypermobility syndrome: Secondary | ICD-10-CM

## 2012-02-07 DIAGNOSIS — M25562 Pain in left knee: Secondary | ICD-10-CM

## 2012-02-07 NOTE — Patient Instructions (Signed)
Try Capzin cream at drug store behind fibular head Use 3 to 4 x per day  Use compression sleeves as much as possible  Keep up Mobic and gabapentin  Train in ways that don't hurt knees  Keep up PT  Recheck with me in 6 weeks

## 2012-02-07 NOTE — Assessment & Plan Note (Signed)
I do not think that this is an intra-articular injury or certainly it is not to the extent that I think surgery would be beneficial  I would be concerned that with her hypermobility the surgery would likely only offered temporary benefit as it did when she had her left partial meniscectomy and as has been found with the intra-articular injections  Keep up physical therapy  Keep up Mobic and gabapentin  Modified exercises and see me again in about 6 weeks

## 2012-02-07 NOTE — Progress Notes (Signed)
  Subjective:    Patient ID: Katherine Wolfe, female    DOB: Mar 02, 1966, 46 y.o.   MRN: [id number]  HPI Still w lots of knee pain.  She was seen by rheumatology and over the course of about 3 months she has had to intra-articular knee injections. The first one gave her relief for about a week until she gets more walking and then these were painful again. The second one did not offer much relief at all.  Rheumatology did not find a specific collagen vascular disease to explain her hypermobility and her raynaud's  Syndrome.  She currently is in physical therapy and has had several sessions. She notices some improvement with taping over her lateral leg upper and lower. She may be getting some benefit from the ultrasound and the iontophoresis.  She can swim or use the elliptical as long as she uses her arms primarily and does not use the lower extremity much. Biking is okay up to 30 minutes. Walking even short distances recreates the pain and sometimes she gets weakness in dorsiflexion of her foot along with pain behind the fibular head.  Review of Systems     Objective:   Physical Exam  No acute distress  Knee: Normal to inspection with no erythema or effusion or obvious bony abnormalities. Palpation normal with no warmth or joint line tenderness or patellar tenderness or condyle tenderness. ROM shows increased mobility in flexion and extension and lower leg rotation. Ligaments show laxity but solid consistent endpoints including ACL, PCL, LCL, MCL. Negative Mcmurray's and provocative meniscal tests. She does have pain over the left medial joint line to palpation left only  Non painful patellar compression. Patellar and quadriceps tendons unremarkable. Hamstring and quadriceps strength is normal.  Tenderness behind the fibular heads bilaterally left greater than right  MSK ultrasound Normal quadriceps tendons and no suprapatellar pouch swelling in left or right Normal patellar  tendons We're able to visualize the meniscus on the lateral edge from the anterior jointline all the way to the posterior and appears normal medially and laterally with the exception of the left lateral posterior shows a wedge probably from prior surgery  Past anserine bursa is visualized bilaterally    with mild fluid increase      Assessment & Plan:

## 2012-02-07 NOTE — Assessment & Plan Note (Signed)
Basically she has instability of both knees that I think recreate her pain and this seems to be specifically related to her hypermobility syndrome

## 2012-02-07 NOTE — Assessment & Plan Note (Signed)
This is improved and I think is primarily from compression

## 2012-02-14 ENCOUNTER — Ambulatory Visit: Payer: BC Managed Care – PPO

## 2012-02-16 ENCOUNTER — Ambulatory Visit: Payer: BC Managed Care – PPO

## 2012-02-19 ENCOUNTER — Ambulatory Visit: Payer: BC Managed Care – PPO

## 2012-02-21 ENCOUNTER — Ambulatory Visit: Payer: BC Managed Care – PPO

## 2012-02-22 ENCOUNTER — Encounter: Payer: Self-pay | Admitting: Gynecology

## 2012-02-28 ENCOUNTER — Ambulatory Visit: Payer: BC Managed Care – PPO

## 2012-03-01 ENCOUNTER — Ambulatory Visit: Payer: BC Managed Care – PPO

## 2012-03-06 ENCOUNTER — Encounter: Payer: Self-pay | Admitting: Gynecology

## 2012-03-06 ENCOUNTER — Ambulatory Visit: Payer: BC Managed Care – PPO

## 2012-03-06 ENCOUNTER — Ambulatory Visit (INDEPENDENT_AMBULATORY_CARE_PROVIDER_SITE_OTHER): Payer: BC Managed Care – PPO | Admitting: Gynecology

## 2012-03-06 VITALS — BP 122/74 | Ht 64.25 in | Wt 118.0 lb

## 2012-03-06 DIAGNOSIS — R350 Frequency of micturition: Secondary | ICD-10-CM

## 2012-03-06 DIAGNOSIS — N926 Irregular menstruation, unspecified: Secondary | ICD-10-CM

## 2012-03-06 DIAGNOSIS — Z01419 Encounter for gynecological examination (general) (routine) without abnormal findings: Secondary | ICD-10-CM

## 2012-03-06 LAB — URINALYSIS W MICROSCOPIC + REFLEX CULTURE
Bilirubin Urine: NEGATIVE
Crystals: NONE SEEN
Glucose, UA: NEGATIVE mg/dL
Ketones, ur: NEGATIVE mg/dL
Protein, ur: NEGATIVE mg/dL
Specific Gravity, Urine: 1.01 (ref 1.005–1.030)

## 2012-03-06 LAB — PROLACTIN: Prolactin: 8 ng/mL

## 2012-03-06 NOTE — Progress Notes (Signed)
Katherine Wolfe April 07, 1966 [id number]        46 y.o.  for annual exam.  Several issues noted below.  Past medical history,surgical history, medications, allergies, family history and social history were all reviewed and documented in the EPIC chart. ROS:  Was performed and pertinent positives and negatives are included in the history.  Exam: Sherri chaperone present Filed Vitals:   03/06/12 1146  BP: 122/74   General appearance  Normal Skin grossly normal Head/Neck normal with no cervical or supraclavicular adenopathy thyroid normal Lungs  clear Cardiac RR, without RMG Abdominal  soft, nontender, without masses, organomegaly or hernia Breasts  examined lying and sitting without masses, retractions, discharge or axillary adenopathy. Pelvic  Ext/BUS/vagina  normal   Cervix  normal   Uterus  anteverted, normal size, shape and contour, midline and mobile nontender   Adnexa  Without masses or tenderness    Anus and perineum  normal   Rectovaginal  normal sphincter tone without palpated masses or tenderness.    Assessment/Plan:  46 y.o. female for annual exam.    1. Irregular menses. Patient notices past year her menses actually have become lighter but a little more irregular where she will go up to 45 days without a menses. They are regular when they come and no intermenstrual bleeding. No other changes such as weight gain, hair changes, skin changes. Will check baseline hCG, prolactin, TSH, FSH.  Options for management include intermittent progesterone withdrawal discussed. She's not really interested versus to monitor although she does go up to 2 months. She knows to call me. If any abnormalities in the blood work that we'll triage based on these results. 2. Contraception. Patient continues to use barrier contraception. We've discussed the failure risks with this and options in the past and she is not interested and prefers to continue with condoms. Plan B availability has been  discussed. 3. Mammography. Patient recently had follow up mammogram due to calcifications which have been stable and she is following expectantly with repeat mammogram in 6 months. SBE monthly reviewed. 4. Pap smear. Patient has no history of significant abnormal Pap smears with multiple normal records in her chart. Her last normal report is 2012. I reviewed current screening guidelines and no Pap smear was done today and we'll plan on less frequent screening at a 3-5 year interval. 5. Urinary frequency/SUI symptoms. Patient has loss of urine with coughing sneezing laughing. Also does frequently although drinks lots of fluids. Will check UA. Kegel exercises reviewed options for surgery discussed and she is not interested. 6. Health maintenance. Glucose lipid profile CBC normal last year and we'll not repeat this year. Assuming she continues well from a gynecologic standpoint and her menses are relatively regular we will follow. If they become significantly irregular she'll represent for evaluation.    [name], 12:29 PM 03/06/2012

## 2012-03-06 NOTE — Patient Instructions (Signed)
Follow up for lab work. Keep menstrual calendar. As long as relatively regular we will follow. If significant skips or irregularity then call.

## 2012-03-08 ENCOUNTER — Ambulatory Visit: Payer: BC Managed Care – PPO

## 2012-03-11 ENCOUNTER — Ambulatory Visit: Payer: BC Managed Care – PPO | Attending: Sports Medicine

## 2012-03-11 DIAGNOSIS — M6281 Muscle weakness (generalized): Secondary | ICD-10-CM | POA: Insufficient documentation

## 2012-03-11 DIAGNOSIS — IMO0001 Reserved for inherently not codable concepts without codable children: Secondary | ICD-10-CM | POA: Insufficient documentation

## 2012-03-11 DIAGNOSIS — M25569 Pain in unspecified knee: Secondary | ICD-10-CM | POA: Insufficient documentation

## 2012-03-13 ENCOUNTER — Ambulatory Visit: Payer: BC Managed Care – PPO

## 2012-03-19 ENCOUNTER — Ambulatory Visit (INDEPENDENT_AMBULATORY_CARE_PROVIDER_SITE_OTHER): Payer: BC Managed Care – PPO | Admitting: Sports Medicine

## 2012-03-19 ENCOUNTER — Other Ambulatory Visit: Payer: Self-pay | Admitting: *Deleted

## 2012-03-19 VITALS — BP 90/60

## 2012-03-19 DIAGNOSIS — M25562 Pain in left knee: Secondary | ICD-10-CM

## 2012-03-19 DIAGNOSIS — IMO0002 Reserved for concepts with insufficient information to code with codable children: Secondary | ICD-10-CM

## 2012-03-19 DIAGNOSIS — R921 Mammographic calcification found on diagnostic imaging of breast: Secondary | ICD-10-CM

## 2012-03-19 DIAGNOSIS — M25569 Pain in unspecified knee: Secondary | ICD-10-CM

## 2012-03-19 NOTE — Assessment & Plan Note (Signed)
This is improved after physical therapy and using the compression  I recommended that we limit her sitting in a lotus position which I think increases the stretch on the tendons over the pes anserine  Keep up exercises prescribed by physical therapy  Use her yoga classes to maintain flexibility   swimming and biking for aerobic conditioning

## 2012-03-19 NOTE — Progress Notes (Signed)
  Subjective:    Patient ID: Katherine Wolfe, female    DOB: May 15, 1966, 46 y.o.   MRN: [id number]  HPI  Pt presents to clinic for f/u of L knee pain which is much improved. She has been doing PT since last visit including iontophoresis and ultrasound. She has been able to swim for 40 min, walking on TM at 2.5 mph for 20 min, used elliptical and stairmaster for 5 min, and bike for 30 min.  She travels often and walks a lot when out of town, this causes increased pain if she does not wear her knee compression.  Much less swelling since she has used regular decompression She continues to use orthotics and they're comfortable   Review of Systems     Objective:   Physical Exam No acute distress  Lt knee exam: Ligament stable but loose Neg mcmurray's  Excellent ROM  Rt knee exam: Ligament stable but loose Neg mcmurray's  Excellent ROM  Today she has no abnormal swelling around either knee      Assessment & Plan:

## 2012-03-19 NOTE — Patient Instructions (Addendum)
Pick a couple of exercises to keep you aerobically fit- biking and swimming are good  Do not emphasize walking for exercises- save this primarily for when you are traveling  Avoid elliptical trainer  Use knee sleeves when doing a lot of standing or walking  Continue home strengthening exercises  Continue using shoes inserts  Please follow up in 3 months or before if needed  Thank you for seeing [place] today!

## 2012-03-19 NOTE — Assessment & Plan Note (Signed)
This is improved  I do not see any sign of cartilage or intra-articular injury to either knee today  Recheck in about 4 months

## 2012-03-20 ENCOUNTER — Other Ambulatory Visit: Payer: Self-pay | Admitting: *Deleted

## 2012-03-20 MED ORDER — MELOXICAM 15 MG PO TABS: 15.0000 mg | ORAL_TABLET | Freq: Every day | ORAL | Status: DC

## 2012-06-19 ENCOUNTER — Ambulatory Visit (INDEPENDENT_AMBULATORY_CARE_PROVIDER_SITE_OTHER): Payer: BC Managed Care – PPO | Admitting: Sports Medicine

## 2012-06-19 VITALS — BP 90/60 | Ht 65.0 in | Wt 114.0 lb

## 2012-06-19 DIAGNOSIS — Q796 Ehlers-Danlos syndrome, unspecified: Secondary | ICD-10-CM

## 2012-06-19 DIAGNOSIS — M357 Hypermobility syndrome: Secondary | ICD-10-CM

## 2012-06-19 DIAGNOSIS — IMO0002 Reserved for concepts with insufficient information to code with codable children: Secondary | ICD-10-CM

## 2012-06-19 DIAGNOSIS — M25519 Pain in unspecified shoulder: Secondary | ICD-10-CM

## 2012-06-19 NOTE — Assessment & Plan Note (Signed)
This is much improved with changes in her exercise regimen and use of compression sleeves with walking  Keep this up  Excellent quad strength and hip abduction strength now

## 2012-06-19 NOTE — Assessment & Plan Note (Signed)
Tr 6 weeks of scapular stabilization exercises Alternate crawl and back stroke Work on this strategy and reck if not resolving   Begin process of weaning neurontin and mobic if pain remains low

## 2012-06-19 NOTE — Patient Instructions (Addendum)
Please do suggested exercises daily- add chest flies at shoulder height holding light dumbbells  For every 2 laps of free style do 1 lap of back stroke  Please reduce mobic to 7.5 mg  Try weaning gabapentin - skip it every 3rd day for 2 weeks, then take every other day for 2 weeks- if no problems with this - ok to stop gabapentin  Please follow up in 4-6 weeks  Thank you for seeing [place] today!

## 2012-06-19 NOTE — Progress Notes (Signed)
  Subjective:    Patient ID: Katherine Wolfe, female    DOB: 1966-04-15, 46 y.o.   MRN: [id number]  HPI  Knees doing much better Not walking for exercise but biking and swimming Doing weights  Takes 300 neurontin per day Takes 15 of Mobic daily Medications did help  Now pain is less and she would like to wean meds  Now with some left shoulder pain after rowing workout Also swimming and mild pain with that  Review of Systems     Objective:   Physical Exam  NAD  Knee bilat Normal to inspection with no erythema or effusion or obvious bony abnormalities. Palpation normal with no warmth or joint line tenderness or patellar tenderness or condyle tenderness. ROM normal in flexion and extension and lower leg rotation. Ligaments with solid consistent endpoints including ACL, PCL, LCL, MCL. Negative Mcmurray's and provocative meniscal tests. Non painful patellar compression. Patellar and quadriceps tendons unremarkable. Hamstring and quadriceps strength is normal.  Mobile knees but no swelling today  Lt shoulder exam Full ROM  Negative speeds and yergason's Neg IR, some pain on ER  Pain with empty can and hawkins No scapular winging Shoulder protraction bilat Scapular assist test lessons pain with abduction and elevation Looseness of left shoulder with sulcus sign       Assessment & Plan:

## 2012-06-19 NOTE — Assessment & Plan Note (Signed)
This appears to be affecting her neck and shoulder position Work on postural exercises

## 2012-07-30 ENCOUNTER — Encounter: Payer: Self-pay | Admitting: Sports Medicine

## 2012-07-30 ENCOUNTER — Ambulatory Visit (INDEPENDENT_AMBULATORY_CARE_PROVIDER_SITE_OTHER): Payer: BC Managed Care – PPO | Admitting: Sports Medicine

## 2012-07-30 VITALS — BP 109/68 | HR 54 | Ht 64.0 in | Wt 114.0 lb

## 2012-07-30 DIAGNOSIS — M25561 Pain in right knee: Secondary | ICD-10-CM

## 2012-07-30 DIAGNOSIS — M25569 Pain in unspecified knee: Secondary | ICD-10-CM

## 2012-07-30 DIAGNOSIS — Q796 Ehlers-Danlos syndrome, unspecified: Secondary | ICD-10-CM

## 2012-07-30 DIAGNOSIS — M357 Hypermobility syndrome: Secondary | ICD-10-CM

## 2012-07-30 NOTE — Assessment & Plan Note (Signed)
The patient will need to keep a series of strengthening exercises to keep her joints as stable as possible She will  wear compression sleeves

## 2012-07-30 NOTE — Progress Notes (Signed)
  Subjective:    Patient ID: Katherine Wolfe, female    DOB: August 17, 1966, 46 y.o.   MRN: [id number]  HPI   F/u RE L shoulder and R knee.  Overall, both problems have significantly improved.  Is no longer taking Gabapentin.  Using Motrin 7.5mg . Believes her shoulder problem is completely resolved.  Has been doing posture and scapular stabilization exercises.   No longer does she note any tingling into her hands or fingers.  Knee only bothersome with walking more than 15 minutes continuously or after intense exercises.  Does occasionally notice extra-articular swelling.  No painful popping/catching.  Describes a dull lateral knee pain distal to knee joint.   Review of Systems Gen: No F/C MSK: Per HPI Neuro: No paresthesias.     Objective:   Physical Exam Shoulder: Left Full ROM. Hypermobility noted. No scapular winging  No TTP Neg Neers/Hawkins.   Neg Crossover. Neg. Speeds  Knee: Right No effusion. Non-specific TTP over medial and lateral knee.  Full ROM.  Hypermobility noted. Neg McMurrays.  Ligamentous stable.   Neg Obers.         Assessment & Plan:  1.  L shoulder pain   Resolved.  Continue with shoulder scapular stabilizing exercises.  2.  R knee pain    2/2 hypermobility syndrome and orthotic modification which was used to tx pes bursitis.     Her pain is overall improved. Continue strengthening exercises. May continue Mobic.  Continue compression sleeve.   F/u 4 months.

## 2012-07-30 NOTE — Assessment & Plan Note (Signed)
Her problems around her knees for started with pes anserine bursa swelling  Now she has excess motion at her fibular heads  Her walking gait is improved Her course strength is improved  We will keep her in orthotics and gradually increase her walking 3 times a week basis  Recheck in 4 months

## 2012-11-23 ENCOUNTER — Other Ambulatory Visit: Payer: Self-pay | Admitting: Sports Medicine

## 2012-12-10 ENCOUNTER — Ambulatory Visit (INDEPENDENT_AMBULATORY_CARE_PROVIDER_SITE_OTHER): Payer: BC Managed Care – PPO | Admitting: Sports Medicine

## 2012-12-10 VITALS — BP 94/58 | Ht 64.0 in | Wt 114.4 lb

## 2012-12-10 DIAGNOSIS — M25561 Pain in right knee: Secondary | ICD-10-CM

## 2012-12-10 DIAGNOSIS — M25562 Pain in left knee: Secondary | ICD-10-CM

## 2012-12-10 DIAGNOSIS — M25512 Pain in left shoulder: Secondary | ICD-10-CM

## 2012-12-10 DIAGNOSIS — M25519 Pain in unspecified shoulder: Secondary | ICD-10-CM

## 2012-12-10 DIAGNOSIS — M25569 Pain in unspecified knee: Secondary | ICD-10-CM

## 2012-12-10 NOTE — Assessment & Plan Note (Signed)
Good improvement  Cont current plan OK to wean some of Mobic Cont compression which helps a lot

## 2012-12-10 NOTE — Progress Notes (Signed)
Patient ID: Katherine Wolfe, female   DOB: 16-Dec-1965, 47 y.o.   MRN: [id number]  Patient is a 47 yo lady with a hx of Raynauds and benign hypermobility  who presents today for f/up for bilateral pes anserine bursitis and hypermobility. She reports exercise improvements walking 25 mins at 3.3 miles/hr twice a week and use of stationary bike for quad strengthening.  Patient also reports L arm numbness and tingling that started 5-6 years ago. Symptoms were cyclical but have progressed to where there is significant symptoms after her period. Carpel tunnel brace prescribed by her PCP made symptoms worse. She has been on scap stab exercises which help for scapular protraction  Exam NAD  Shoulder: normal internal and external rotation bilaterally. Tenderness with palpation at Erbs point on left causing + tinel's into arm Good strength However, at rest she gets mild winging of scapulae and more left shoulder protraction  90 deg int / 45 ext hip rotation bilaterally No pain on testing  Bilat Knee: Normal to inspection with no erythema or effusion or obvious bony abnormalities. Palpation normal with no warmth or joint line tenderness or patellar tenderness or condyle tenderness. ROM normal in flexion and extension and lower leg rotation. Ligaments with solid consistent endpoints including ACL, PCL, LCL, MCL. Negative Mcmurray's and provocative meniscal tests. Non painful patellar compression. Patellar and quadriceps tendons unremarkable. Hamstring and quadriceps strength is normal.  No swelling over pes and no excessive TTP bilat.

## 2012-12-10 NOTE — Assessment & Plan Note (Signed)
She is getting some brachial plexus stretch  I added new exercises to improve posture and lessen sxs  Add stretches as noted  Reck 3 to 4 mos

## 2012-12-10 NOTE — Patient Instructions (Addendum)
For the arm numbness  Work bolster and posture exercises - 15 to 20 secs x 3  Restart lawnmower and robbery  Upright row - concentrate of full back extension  2 shoulder exercises left primarily - outward rotation in back position hold 10 x count of 5  Then at waist level with elbow bent - same way  Watch computer position

## 2013-02-07 ENCOUNTER — Other Ambulatory Visit: Payer: Self-pay | Admitting: *Deleted

## 2013-02-19 ENCOUNTER — Encounter: Payer: Self-pay | Admitting: Gynecology

## 2013-03-07 ENCOUNTER — Encounter: Payer: Self-pay | Admitting: Gynecology

## 2013-03-07 ENCOUNTER — Ambulatory Visit (INDEPENDENT_AMBULATORY_CARE_PROVIDER_SITE_OTHER): Payer: BC Managed Care – PPO | Admitting: Gynecology

## 2013-03-07 VITALS — BP 104/68 | Ht 63.75 in | Wt 117.0 lb

## 2013-03-07 DIAGNOSIS — Z13220 Encounter for screening for lipoid disorders: Secondary | ICD-10-CM

## 2013-03-07 DIAGNOSIS — Z01419 Encounter for gynecological examination (general) (routine) without abnormal findings: Secondary | ICD-10-CM

## 2013-03-07 DIAGNOSIS — N926 Irregular menstruation, unspecified: Secondary | ICD-10-CM

## 2013-03-07 NOTE — Progress Notes (Signed)
Katherine Wolfe 01-11-1966 [id number]        46 y.o.  G0P0 for annual exam.   Several issues noted below.  Past medical history,surgical history, medications, allergies, family history and social history were all reviewed and documented in the EPIC chart. ROS:  Was performed and pertinent positives and negatives are included in the history.  Exam: Sherrilyn Rist assistant Filed Vitals:   03/07/13 1554  BP: 104/68  Height: 5' 3.75" (1.619 m)  Weight: 117 lb (53.071 kg)   General appearance  Normal Skin grossly normal Head/Neck normal with no cervical or supraclavicular adenopathy thyroid normal Lungs  clear Cardiac RR, without RMG Abdominal  soft, nontender, without masses, organomegaly or hernia Breasts  examined lying and sitting without masses, retractions, discharge or axillary adenopathy. Pelvic  Ext/BUS/vagina  normal   Cervix  normal   Uterus  anteverted, normal size, shape and contour, midline and mobile nontender   Adnexa  Without masses or tenderness    Anus and perineum  normal   Rectovaginal  normal sphincter tone without palpated masses or tenderness.    Assessment/Plan:  47 y.o. G0P0 female for annual exam.   1. Irregular menses. Patient's periods are occurring monthly. They're lighter than before but does alternate as far as how long they last period sometime she'll linger and last a week and other months had several days of menses. We checked hormone levels last year to include an [facility] of 3 for similar complaints. She's not having hot flushes night sweats or other symptoms. Recommend monitoring for now. If significant irregularity or she develops menopausal symptoms she'll represent for evaluation. 2. Birth control. Patient continues with their contraception. We have had this discussion before and I again reviewed the failure risk with her and plan the options. Patient's comfortable continuing accepts the failure risk. 3. Mammography this month. Continue with annual  mammography. SBE monthly reviewed. 4. Pap smear 2012. present today. No history of significant abnormal Pap smears. Review current screening guidelines we'll plan repeat next year a 3 year interval. 5. Health maintenance. Initially ordered CBC comprehensive metabolic panel lipid profile urinalysis. Patient subsequently declined stating she is going to see Dr. Uvaldo Rising for her annual and would prefer to wait and have it done through their office. Followup one year, sooner as needed.    [name], 4:18 PM 03/07/2013

## 2013-03-07 NOTE — Patient Instructions (Signed)
Followup in one year for annual exam. Sooner if significant menstrual irregularity or menopausal symptoms present.

## 2013-03-08 LAB — URINALYSIS W MICROSCOPIC + REFLEX CULTURE
Bacteria, UA: NONE SEEN
Casts: NONE SEEN
Crystals: NONE SEEN
Glucose, UA: NEGATIVE mg/dL
Hgb urine dipstick: NEGATIVE
Ketones, ur: NEGATIVE mg/dL
Leukocytes, UA: NEGATIVE
pH: 7.5 (ref 5.0–8.0)

## 2013-03-12 ENCOUNTER — Encounter: Payer: Self-pay | Admitting: Gynecology

## 2013-05-22 ENCOUNTER — Ambulatory Visit (INDEPENDENT_AMBULATORY_CARE_PROVIDER_SITE_OTHER): Payer: BC Managed Care – PPO | Admitting: Sports Medicine

## 2013-05-22 ENCOUNTER — Encounter: Payer: Self-pay | Admitting: Sports Medicine

## 2013-05-22 VITALS — BP 102/68 | HR 65 | Ht 64.0 in | Wt 115.0 lb

## 2013-05-22 DIAGNOSIS — Q796 Ehlers-Danlos syndrome, unspecified: Secondary | ICD-10-CM

## 2013-05-22 DIAGNOSIS — M25569 Pain in unspecified knee: Secondary | ICD-10-CM

## 2013-05-22 DIAGNOSIS — M357 Hypermobility syndrome: Secondary | ICD-10-CM

## 2013-05-22 DIAGNOSIS — M25561 Pain in right knee: Secondary | ICD-10-CM

## 2013-05-22 MED ORDER — MELOXICAM 7.5 MG PO TABS: 7.5000 mg | ORAL_TABLET | Freq: Every day | ORAL | Status: DC

## 2013-05-22 NOTE — Patient Instructions (Signed)
It was nice to see you today, thanks for coming in!  Problem List Items Addressed This Visit   Knee pain, bilateral - Primary     Keep taking mobic.  New prescription for 7.5 mg tablets.  Take 1-2 per day. Keep up exercises and using compression with activity Use the Diclofenac cream as needed    Relevant Medications      meloxicam (MOBIC) tablet   Benign hypermobility syndrome     Keep using performing upper arm exercises and focus on posture.          Please plan to return to see Dr. Darrick Penna in 1 year.  If you need anything prior to your next visit please call the clinic.  Please Bring all medications or accurate medication list with you to each appointment; an accurate medication list is essential in providing you the best care possible.

## 2013-05-22 NOTE — Progress Notes (Signed)
  [facility]  Patient name: Katherine Wolfe MRN [id number]  Date of birth: 1966-09-28  CC & HPI:  Katherine Wolfe is a 47 y.o. female who is returning to care.   Chief Complaint  Patient presents with  . Follow-up    knee pain  significant improvement in bilateral knee pain with exercises and compression while exercising.   She is no longer having any left arm numbness.  She feels like this is associated with doing her stretching exercises.   She reports she is continuing to take 15 mg MOBIC but has been cutting these in half with continued relief.   She is using topical voltaren prior to exercise over her bilateral medial knees.   She has no other changes in her health and reports overall doing well.      ROS:  See HPI  Pertinent History Reviewed:  Medical & Surgical Hx:  Reviewed: Significant for hypermobility syndrome, this, unequal leg length with correction 2 custom orthotics. Medications: Reviewed & Updated - see associated section Social History: Reviewed -  reports that she quit smoking about 17 years ago. She has never used smokeless tobacco.   Objective Findings:  Vitals: BP 102/68  Pulse 65  Ht 5\' 4"  (1.626 m)  Wt 115 lb (52.164 kg)  BMI 19.73 kg/m2  PE: GENERAL:  athletic adult l female. In no discomfort; no respiratory distress  PSYCH:  alert and appropriate, good insight      NeuroMSK  bilateral knee Exam: Appear:  normal-appearing, no effusion   Palp:  minimal tenderness to palpation over left pes anserine bursa.  No tenderness palpation on right.    ROM:  full unrestricted range of motion   NV:   lower extremity myotomes 5+ out of 5 , hip abduction 5+/5,   Testing:  negative anterior posterior drawer, negative Lachman's, no ligamentous laxity with varus and valgus strain  She is a neutral gait with no pelvic shift          Assessment & Plan:   1. Knee pain, bilateral   2. Benign hypermobility syndrome    See problem associated  charting

## 2013-05-22 NOTE — Assessment & Plan Note (Addendum)
Keep using performing upper arm exercises and focus on posture.    She can follow up yearly or for problems

## 2013-05-22 NOTE — Assessment & Plan Note (Signed)
Keep taking mobic.  New prescription for 7.5 mg tablets.  Take 1-2 per day. Keep up exercises and using compression with activity Use the Diclofenac cream as needed

## 2013-06-18 ENCOUNTER — Ambulatory Visit (INDEPENDENT_AMBULATORY_CARE_PROVIDER_SITE_OTHER): Payer: BC Managed Care – PPO | Admitting: Sports Medicine

## 2013-06-18 VITALS — BP 98/62 | Ht 64.0 in | Wt 116.0 lb

## 2013-06-18 DIAGNOSIS — S060X0A Concussion without loss of consciousness, initial encounter: Secondary | ICD-10-CM

## 2013-06-18 NOTE — Patient Instructions (Signed)
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 Rest as much as possible.
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
## 2013-07-01 ENCOUNTER — Ambulatory Visit (INDEPENDENT_AMBULATORY_CARE_PROVIDER_SITE_OTHER): Payer: BC Managed Care – PPO | Admitting: Family Medicine

## 2013-07-01 VITALS — BP 110/70 | Ht 64.0 in | Wt 116.0 lb

## 2013-07-01 DIAGNOSIS — Z5189 Encounter for other specified aftercare: Secondary | ICD-10-CM

## 2013-07-01 DIAGNOSIS — J019 Acute sinusitis, unspecified: Secondary | ICD-10-CM

## 2013-07-01 DIAGNOSIS — S060X0D Concussion without loss of consciousness, subsequent encounter: Secondary | ICD-10-CM

## 2013-07-01 MED ORDER — AMOXICILLIN 500 MG PO TABS: 1000.0000 mg | ORAL_TABLET | Freq: Two times a day (BID) | ORAL | Status: DC
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
## 2013-07-01 NOTE — Patient Instructions (Signed)
Thank you for coming in today  You may start to return to normal exercise gradually over the next 3-5 days Take amoxicillin 2 tablets 2 x per day for 10 days. If not improving by next week see primary doctor. Followup as needed

## 2013-11-12 ENCOUNTER — Other Ambulatory Visit: Payer: Self-pay | Admitting: Dermatology

## 2013-12-03 ENCOUNTER — Ambulatory Visit (INDEPENDENT_AMBULATORY_CARE_PROVIDER_SITE_OTHER): Payer: No Typology Code available for payment source | Admitting: Family Medicine

## 2013-12-03 ENCOUNTER — Encounter: Payer: Self-pay | Admitting: Family Medicine

## 2013-12-03 VITALS — BP 106/71 | HR 58 | Ht 64.0 in | Wt 116.0 lb

## 2013-12-03 DIAGNOSIS — M84376A Stress fracture, unspecified foot, initial encounter for fracture: Secondary | ICD-10-CM

## 2013-12-03 DIAGNOSIS — M79609 Pain in unspecified limb: Secondary | ICD-10-CM

## 2013-12-03 DIAGNOSIS — M79671 Pain in right foot: Secondary | ICD-10-CM

## 2013-12-03 NOTE — Patient Instructions (Addendum)
You can get a post op shoe at [facility] Located at: [address] [phone]  Please follow up in 3 weeks

## 2013-12-03 NOTE — Progress Notes (Signed)
CC: Right forefoot pain HPI: Patient is a very pleasant 48 year old active female who presents for evaluation of right forefoot pain for the last 2 weeks. She is active in cycling, swimming, yoga and Pilates. She states that it started when she was doing a downward dog with her weight on her metatarsal heads and her toes and extension. She states that the pain has worsened in the ball of her foot for the last 2 weeks to the point where she is limping. It is exacerbated by putting weight on the forefoot. She got a gel pad and put this under her forefoot but this actually made it worse. She notes the pain is more significant in the morning and improves somewhat as the day goes on. She has tried some topical for apparent gel as well as the Mobic. She does get some numbness that seems worse than her baseline ray notes.  ROS: As above in the HPI. All other systems are stable or negative.  OBJECTIVE: APPEARANCE:  Patient in no acute distress.The patient appeared well nourished and normally developed. HEENT: No scleral icterus. Conjunctiva non-injected Resp: Non labored Skin: No rash MSK:  Foot exam:  - On inspection, there is noted to be ecchymosis over the dorsum of the foot at the second metatarsal shaft as well as swelling under the plantar metatarsal heads.  - Gait is antalgic - There is tenderness to palpation over the second metatarsal shaft as well as over the plantar aspect of the far distal metatarsal heads but no other tenderness to palpation.  - Ankle range of motion is full without pain. - Strength is 5 out of 5 in ankle and foot.  - Neurovascular status normal.   MSK US: Limited ultrasound of the right foot was performed in transverse and longitudinal views. Second metatarsal shaft was visualized in the longitudinal view. There was substantial hypoechoic change overlying the bone without disruption of the cortex. Increased Doppler signal over the cortex of the bone. The plantar aspect  of the second and third metatarsal head was also visualized and transverse view today. There was hypoechoic change overlying the bone without increased Doppler signal.   ASSESSMENT: #1. Second metatarsal stress reaction #2. Metatarsalgia versus metatarsal head stress reaction   PLAN: A metatarsal pad was added to the patient's shoe today. She was encouraged to try this for a couple days. However if she continues limping and does not have improvement in her pain she will need to go to the medical supply store and obtain a postop shoe. She was encouraged to apply ice to the area several times a day. She may do swimming and cycling but should avoid things like running as well as yoga positions that place weight on the metatarsal heads. I will see her back in 3 weeks for recheck.

## 2013-12-30 ENCOUNTER — Ambulatory Visit (INDEPENDENT_AMBULATORY_CARE_PROVIDER_SITE_OTHER): Payer: No Typology Code available for payment source | Admitting: Family Medicine

## 2013-12-30 ENCOUNTER — Encounter: Payer: Self-pay | Admitting: Family Medicine

## 2013-12-30 VITALS — BP 97/64 | Ht 64.0 in | Wt 118.0 lb

## 2013-12-30 DIAGNOSIS — M84376A Stress fracture, unspecified foot, initial encounter for fracture: Secondary | ICD-10-CM

## 2013-12-30 DIAGNOSIS — M84374A Stress fracture, right foot, initial encounter for fracture: Secondary | ICD-10-CM

## 2013-12-30 NOTE — Patient Instructions (Signed)
Thank you for coming in today  Okay to switch to normal supportive shoes. Wean out of post op shoe Hold off on yoga until you can walk completely pain free Okay for pool, elliptical If the numbness everywhere returns we need to work this up. Followup if not making good improvement in 4 weeks

## 2014-01-06 NOTE — Progress Notes (Signed)
CC: Followup right foot pain HPI: Patient presents for followup of right foot pain. I last saw her we diagnosed her with a stress reaction of the second metatarsal. She had a layer of hypoechoic fluid overlying the intact bony cortex as well as increased Doppler signal. She has been a postop shoe since that time. She notes significantly less pain. It does still bother her some. She switched to a regular shoe today. She is still little bit sore he notes numbness in all 4 of her smaller toes. She is getting some soreness in her left foot as well as well as numbness in that foot. She does have a history of Raynaud's.  ROS: As above in the HPI. All other systems are stable or negative.  OBJECTIVE: APPEARANCE:  Patient in no acute distress.The patient appeared well nourished and normally developed. HEENT: No scleral icterus. Conjunctiva non-injected Resp: Non labored Skin: No rash MSK: Foot exam: - Mild tenderness to palpation along the second metatarsal, significantly decreased. -Evidence of Raynaud's with purple coloration of the toes and chilblains  MSK US: Limited ultrasound of the bilateral feet was performed today. The right second metatarsal visualized. Flare of hypoechoic fluid no longer present. No increased Doppler signal. No evidence of stress fracture. Decreased hypoechoic fluid surrounding the metatarsal heads. No evidence of Morton's neuroma.   ASSESSMENT: #1. Improving right second metatarsal stress reaction #2. Raynaud's   PLAN: Continue to wear supportive shoes. Gradually increase activity. Continue swimming and biking. When she is able to walk completely pain-free we will allow her to slowly increase speed and then gradually return to yoga. We did caution her that with her significant hypermobility she will need to be careful about her positioning and she will place weight on her joints and bones are not typically weightbearing.

## 2014-02-20 ENCOUNTER — Encounter: Payer: Self-pay | Admitting: Gynecology

## 2014-03-19 ENCOUNTER — Ambulatory Visit (INDEPENDENT_AMBULATORY_CARE_PROVIDER_SITE_OTHER): Payer: No Typology Code available for payment source | Admitting: Gynecology

## 2014-03-19 ENCOUNTER — Encounter: Payer: Self-pay | Admitting: Gynecology

## 2014-03-19 ENCOUNTER — Other Ambulatory Visit (HOSPITAL_COMMUNITY)
Admission: RE | Admit: 2014-03-19 | Discharge: 2014-03-19 | Disposition: A | Discharge status: Home / Self Care | Payer: No Typology Code available for payment source | Source: Ambulatory Visit | Attending: Gynecology | Admitting: Gynecology

## 2014-03-19 VITALS — BP 116/70 | Ht 64.0 in | Wt 122.0 lb

## 2014-03-19 DIAGNOSIS — Z01419 Encounter for gynecological examination (general) (routine) without abnormal findings: Secondary | ICD-10-CM

## 2014-03-19 DIAGNOSIS — Z124 Encounter for screening for malignant neoplasm of cervix: Secondary | ICD-10-CM | POA: Insufficient documentation

## 2014-03-19 DIAGNOSIS — Z1151 Encounter for screening for human papillomavirus (HPV): Secondary | ICD-10-CM | POA: Insufficient documentation

## 2014-03-19 NOTE — Progress Notes (Signed)
Katherine Wolfe [birth year] [id number]        48 y.o.  G0P0 for annual exam.  Doing well without complaints.  Past medical history,surgical history, problem list, medications, allergies, family history and social history were all reviewed and documented as reviewed in the EPIC chart.  ROS:  12 system ROS performed with pertinent positives and negatives included in the history, assessment and plan.  Included Systems: General, HEENT, Neck, Cardiovascular, Pulmonary, Gastrointestinal, Genitourinary, Musculoskeletal, Dermatologic, Endocrine, Hematological, Neurologic, Psychiatric Additional significant findings :  None   Exam: Kim assistant Filed Vitals:   03/19/14 1545  BP: 116/70  Height: 5\' 4"  (1.626 m)  Weight: 122 lb (55.339 kg)   General appearance:  Normal affect, orientation and appearance. Skin: Grossly normal HEENT: Without gross lesions.  No cervical or supraclavicular adenopathy. Thyroid normal.  Lungs:  Clear without wheezing, rales or rhonchi Cardiac: RR, without RMG Abdominal:  Soft, nontender, without masses, guarding, rebound, organomegaly or hernia Breasts:  Examined lying and sitting without masses, retractions, discharge or axillary adenopathy. Pelvic:  Ext/BUS/vagina normal with slight staining. Patient in detail and her menses.  Cervix normal. Pap/HPV  Uterus anteverted, normal size, shape and contour, midline and mobile nontender   Adnexa  Without masses or tenderness    Anus and perineum  Normal   Rectovaginal  Normal sphincter tone without palpated masses or tenderness.    Assessment/Plan:  48 y.o. G0P0 female for annual exam with regular menses, condom contraception.   1. Condom contraception. I reviewed the failure risk and availability of plan B. Patient not interested in anything else. She understands and accepts the failure risk. 2. Pap smear 2012. Pap/HPV today. Does have history of cryosurgery a number of years ago with normal Pap smears afterwards.  Assuming this Pap smear is normal then plan 3-5 year interval repeat per current screening guidelines. 3. Mammogram 02/2014. Continue annual mammography. SBE monthly reviewed. 4. Health maintenance. No blood work done as patient reports it done through Dr. Mickie Kay office within the past year. Followup one year, sooner as needed.   Note: This document was prepared with digital dictation and possible smart phrase technology. Any transcriptional errors that result from this process are unintentional.   [name], 4:24 PM 03/19/2014

## 2014-03-19 NOTE — Addendum Note (Signed)
Addended by: Dayna Barker on: 03/19/2014 04:34 PM   Modules accepted: Orders

## 2014-03-19 NOTE — Patient Instructions (Signed)
Followup in one year, sooner as needed.  You may obtain a copy of any labs that were done today by logging onto MyChart as outlined in the instructions provided with your AVS (after visit summary). The office will not call with normal lab results but certainly if there are any significant abnormalities then we will contact you.   Health Maintenance, Female A healthy lifestyle and preventative care can promote health and wellness.  Maintain regular health, dental, and eye exams.  Eat a healthy diet. Foods like vegetables, fruits, whole grains, low-fat dairy products, and lean protein foods contain the nutrients you need without too many calories. Decrease your intake of foods high in solid fats, added sugars, and salt. Get information about a proper diet from your caregiver, if necessary.  Regular physical exercise is one of the most important things you can do for your health. Most adults should get at least 150 minutes of moderate-intensity exercise (any activity that increases your heart rate and causes you to sweat) each week. In addition, most adults need muscle-strengthening exercises on 2 or more days a week.   Maintain a healthy weight. The body mass index (BMI) is a screening tool to identify possible weight problems. It provides an estimate of body fat based on height and weight. Your caregiver can help determine your BMI, and can help you achieve or maintain a healthy weight. For adults 20 years and older:  A BMI below 18.5 is considered underweight.  A BMI of 18.5 to 24.9 is normal.  A BMI of 25 to 29.9 is considered overweight.  A BMI of 30 and above is considered obese.  Maintain normal blood lipids and cholesterol by exercising and minimizing your intake of saturated fat. Eat a balanced diet with plenty of fruits and vegetables. Blood tests for lipids and cholesterol should begin at age 41 and be repeated every 5 years. If your lipid or cholesterol levels are high, you are over  50, or you are a high risk for heart disease, you may need your cholesterol levels checked more frequently.Ongoing high lipid and cholesterol levels should be treated with medicines if diet and exercise are not effective.  If you smoke, find out from your caregiver how to quit. If you do not use tobacco, do not start.  Lung cancer screening is recommended for adults aged 49 80 years who are at high risk for developing lung cancer because of a history of smoking. Yearly low-dose computed tomography (CT) is recommended for people who have at least a 30-pack-year history of smoking and are a current smoker or have quit within the past 15 years. A pack year of smoking is smoking an average of 1 pack of cigarettes a day for 1 year (for example: 1 pack a day for 30 years or 2 packs a day for 15 years). Yearly screening should continue until the smoker has stopped smoking for at least 15 years. Yearly screening should also be stopped for people who develop a health problem that would prevent them from having lung cancer treatment.  If you are pregnant, do not drink alcohol. If you are breastfeeding, be very cautious about drinking alcohol. If you are not pregnant and choose to drink alcohol, do not exceed 1 drink per day. One drink is considered to be 12 ounces (355 mL) of beer, 5 ounces (148 mL) of wine, or 1.5 ounces (44 mL) of liquor.  Avoid use of street drugs. Do not share needles with anyone. Ask for help  if you need support or instructions about stopping the use of drugs.  High blood pressure causes heart disease and increases the risk of stroke. Blood pressure should be checked at least every 1 to 2 years. Ongoing high blood pressure should be treated with medicines, if weight loss and exercise are not effective.  If you are 55 to 49 years old, ask your caregiver if you should take aspirin to prevent strokes.  Diabetes screening involves taking a blood sample to check your fasting blood sugar level.  This should be done once every 3 years, after age 45, if you are within normal weight and without risk factors for diabetes. Testing should be considered at a younger age or be carried out more frequently if you are overweight and have at least 1 risk factor for diabetes.  Breast cancer screening is essential preventative care for women. You should practice "breast self-awareness." This means understanding the normal appearance and feel of your breasts and may include breast self-examination. Any changes detected, no matter how small, should be reported to a caregiver. Women in their 20s and 30s should have a clinical breast exam (CBE) by a caregiver as part of a regular health exam every 1 to 3 years. After age 40, women should have a CBE every year. Starting at age 40, women should consider having a mammogram (breast X-ray) every year. Women who have a family history of breast cancer should talk to their caregiver about genetic screening. Women at a high risk of breast cancer should talk to their caregiver about having an MRI and a mammogram every year.  Breast cancer gene (BRCA)-related cancer risk assessment is recommended for women who have family members with BRCA-related cancers. BRCA-related cancers include breast, ovarian, tubal, and peritoneal cancers. Having family members with these cancers may be associated with an increased risk for harmful changes (mutations) in the breast cancer genes BRCA1 and BRCA2. Results of the assessment will determine the need for genetic counseling and BRCA1 and BRCA2 testing.  The Pap test is a screening test for cervical cancer. Women should have a Pap test starting at age 21. Between ages 21 and 29, Pap tests should be repeated every 2 years. Beginning at age 30, you should have a Pap test every 3 years as long as the past 3 Pap tests have been normal. If you had a hysterectomy for a problem that was not cancer or a condition that could lead to cancer, then you no  longer need Pap tests. If you are between ages 65 and 70, and you have had normal Pap tests going back 10 years, you no longer need Pap tests. If you have had past treatment for cervical cancer or a condition that could lead to cancer, you need Pap tests and screening for cancer for at least 20 years after your treatment. If Pap tests have been discontinued, risk factors (such as a new sexual partner) need to be reassessed to determine if screening should be resumed. Some women have medical problems that increase the chance of getting cervical cancer. In these cases, your caregiver may recommend more frequent screening and Pap tests.  The human papillomavirus (HPV) test is an additional test that may be used for cervical cancer screening. The HPV test looks for the virus that can cause the cell changes on the cervix. The cells collected during the Pap test can be tested for HPV. The HPV test could be used to screen women aged 30 years and older, and   should be used in women of any age who have unclear Pap test results. After the age of 30, women should have HPV testing at the same frequency as a Pap test.  Colorectal cancer can be detected and often prevented. Most routine colorectal cancer screening begins at the age of 50 and continues through age 75. However, your caregiver may recommend screening at an earlier age if you have risk factors for colon cancer. On a yearly basis, your caregiver may provide home test kits to check for hidden blood in the stool. Use of a small camera at the end of a tube, to directly examine the colon (sigmoidoscopy or colonoscopy), can detect the earliest forms of colorectal cancer. Talk to your caregiver about this at age 50, when routine screening begins. Direct examination of the colon should be repeated every 5 to 10 years through age 75, unless early forms of pre-cancerous polyps or small growths are found.  Hepatitis C blood testing is recommended for all people born from  1945 through 1965 and any individual with known risks for hepatitis C.  Practice safe sex. Use condoms and avoid high-risk sexual practices to reduce the spread of sexually transmitted infections (STIs). Sexually active women aged 25 and younger should be checked for Chlamydia, which is a common sexually transmitted infection. Older women with new or multiple partners should also be tested for Chlamydia. Testing for other STIs is recommended if you are sexually active and at increased risk.  Osteoporosis is a disease in which the bones lose minerals and strength with aging. This can result in serious bone fractures. The risk of osteoporosis can be identified using a bone density scan. Women ages 65 and over and women at risk for fractures or osteoporosis should discuss screening with their caregivers. Ask your caregiver whether you should be taking a calcium supplement or vitamin D to reduce the rate of osteoporosis.  Menopause can be associated with physical symptoms and risks. Hormone replacement therapy is available to decrease symptoms and risks. You should talk to your caregiver about whether hormone replacement therapy is right for you.  Use sunscreen. Apply sunscreen liberally and repeatedly throughout the day. You should seek shade when your shadow is shorter than you. Protect yourself by wearing long sleeves, pants, a wide-brimmed hat, and sunglasses year round, whenever you are outdoors.  Notify your caregiver of new moles or changes in moles, especially if there is a change in shape or color. Also notify your caregiver if a mole is larger than the size of a pencil eraser.  Stay current with your immunizations. Document Released: 04/10/2011 Document Revised: 01/20/2013 Document Reviewed: 04/10/2011 [employer] Patient Information 2014 [employer].   

## 2014-03-23 LAB — CYTOLOGY - PAP

## 2014-10-16 ENCOUNTER — Other Ambulatory Visit: Payer: Self-pay | Admitting: Sports Medicine

## 2014-11-27 ENCOUNTER — Encounter: Payer: Self-pay | Admitting: Sports Medicine

## 2014-11-27 ENCOUNTER — Ambulatory Visit (INDEPENDENT_AMBULATORY_CARE_PROVIDER_SITE_OTHER): Payer: No Typology Code available for payment source | Admitting: Sports Medicine

## 2014-11-27 VITALS — BP 97/65 | Ht 64.0 in | Wt 120.0 lb

## 2014-11-27 DIAGNOSIS — M25511 Pain in right shoulder: Secondary | ICD-10-CM

## 2014-11-27 MED ORDER — NITROGLYCERIN 0.2 MG/HR TD PT24: MEDICATED_PATCH | TRANSDERMAL | Status: DC

## 2014-11-27 NOTE — Patient Instructions (Signed)
Shoulder exercises discussed: Start with 10 seconds of 5 each set area workup to 3 sets per day for #1 - shoulders at side turning hands in, turning hands out, pulling arms up, turning hands over   Nitroglycerin Protocol   Apply 1/4 nitroglycerin patch to affected area daily.  Change position of patch within the affected area every 24 hours.  You may experience a headache during the first 1-2 weeks of using the patch, these should subside.  If you experience headaches after beginning nitroglycerin patch treatment, you may take your preferred over the counter pain reliever.  Another side effect of the nitroglycerin patch is skin irritation or rash related to patch adhesive.  Please notify our office if you develop more severe headaches or rash, and stop the patch.  Tendon healing with nitroglycerin patch may require 12 to 24 weeks depending on the extent of injury.  Men should not use if taking Viagra, Cialis, or Levitra.   Do not use if you have migraines or rosacea.

## 2014-11-30 NOTE — Progress Notes (Signed)
  Katherine DunkerMichelle A Wolfe - 49 y.o. female MRN [id number]  Date of birth: Aug 20, 1966  SUBJECTIVE: CC: Right shoulder pain, initial evaluation HPI: Insidious onset of pain without known incident, worsening since October 2015  Significant pain with swimming, also painful to lay on on occasion.  No crepitation, cavitations or significant mechanical symptoms  Pain occasionally radiates down towards elbow no overt hand pain  Described as a toothache, no numbness or tingling.  Prior issues with cervical spine, evaluated by Dr. Ophelia CharterYates in 1998. No intervention recommended at that time.  Known hypermobility syndrome  ROS: per HPI  HISTORY:  Past Medical, Surgical, Social, and Family History reviewed & updated per EMR.  Pertinent Historical Findings include:  reports that she quit smoking about 19 years ago. She has never used smokeless tobacco.  OBJECTIVE:  VS:   HT:5\' 4"  (162.6 cm)   WT:120 lb (54.432 kg)  BMI:20.6          BP:97/65 mmHg  HR: bpm  TEMP: ( )  RESP:   PHYSICAL EXAM:  GENERAL: Adult Caucasian female. No acute distress PSYCH: Alert and appropriately interactive. VASCULAR: Bilateral radial pulses 2+/4 NEURO: Upper extremity strength is 5+/5 in all myotomes; sensation is intact to light touch in all dermatomes. DTRs 2+ in bilateral upper extremities RIGHT SHOULDER: Overall normal-appearing at rest, slight right scapular winging  Fluid and full active range of motion, no significance scapular dyskinesia  Minimal pain & crepitation on right with overhead axial loading and circumduction; normal on left  Internal rotation strength 5-/5 with mild amount of pain, external rotation strength normal  Normal empty can, Hawkins, Neers  Painful speeds and Yergason's  Pain over the bicipital groove  DATA OBTAINED DURING VISIT:   Limited MSK Ultrasound of Right Shoulder: Findings: Biceps Tendon: Subluxed& hypoechoic changes surrounding the tendon, small-diameter but tendon  intact Pec Major Insertion: Normal Subscapularis Tendon: Distal insertion with hypoechoic change and splitting without significant retraction Supraspinatus Tendon: Normal Infraspinatus/Teres Minor Tendon: Normal AC Joint: Minimal degenerative changes Posterior glenohumeral joint overall normal-appearing, visualized portion of posterior labrum appears normal    Impression: The above findings are consistent with long head of biceps subluxation with subscapularis tendinopathy with minimal tearing         ASSESSMENT: 1. Right shoulder pain     Problem  Right Shoulder Pain   biceps subluxation with suspected SLAP degenerative tear with subscapularis tendinopathy     PLAN: See problem based charting & AVS for additional documentation.  Nitroglycerin protocol  HEP: Biceps strengthening and subscapularis isometric exercises per AVS  If any worsening or new radicular symptoms consider further evaluation of cervical spine however seems more consistent with mechanical shoulder symptoms. > Return in about 3 weeks (around 12/18/2014).

## 2014-12-17 ENCOUNTER — Encounter: Payer: Self-pay | Admitting: Sports Medicine

## 2014-12-17 ENCOUNTER — Ambulatory Visit (INDEPENDENT_AMBULATORY_CARE_PROVIDER_SITE_OTHER): Payer: No Typology Code available for payment source | Admitting: Sports Medicine

## 2014-12-17 VITALS — BP 96/62 | Ht 64.0 in | Wt 120.0 lb

## 2014-12-17 DIAGNOSIS — M25511 Pain in right shoulder: Secondary | ICD-10-CM

## 2014-12-17 NOTE — Assessment & Plan Note (Signed)
Not much change but was doing HEP too aggressively and too much ER  Modify HEP  Cont use of NTG  Do HEP more frequently  Reck in 6 weeks with repeat [place]

## 2014-12-17 NOTE — Progress Notes (Signed)
Patient ID: [name], female   DOB: 12/18/1965, [age] y.o.   MRN: [id number]  Follow up of: Bicipital subluxation Partial tear of subscap Suspected labral fraying  This seems related to her hypermobility  ON NTG protocol with some improvement but not a lot HEP is painful  On review she is using too strong a band and also not doing exercises correctly  Exam NAD BP 96/62 mmHg  Ht 5\' 4"  (1.626 m)  Wt 120 lb (54.432 kg)  BMI 20.59 kg/m2  Neck ROM full and does not trigger pain  RT Shoulder: Inspection reveals RT scapular protraction but no, atrophy  Palpation is normal with mild  tenderness over bicipital groove. ROM is full in all planes. Rotator cuff strength normal throughout. No signs of impingement with negative Neer and Hawkin's tests, empty can. Speeds and Yergason's tests cause mild pain/ she gets palpable motion of BT on supination  negative Obrien's, pain and clicking on Jobes test but no clunk  Feels limited stability.with Er and abduction Normal scapularmotion observed. No painful arc and no drop arm sign. No apprehension sign

## 2014-12-17 NOTE — Patient Instructions (Signed)
Change the home exercises  Internal rotation with elbows at your side - cross belly button/ don't take back too far Biceps curls Shoulder flexion to 90 Flys but only externally rotate to 45 degrees Forearm rolls  3 sets of 15 with light weight Do two or three times a day if you get a chance  Keep up with NTG  See me in 5 to 6 weeks so I can rescan at that time

## 2015-02-03 ENCOUNTER — Encounter: Payer: Self-pay | Admitting: Sports Medicine

## 2015-02-03 ENCOUNTER — Ambulatory Visit (INDEPENDENT_AMBULATORY_CARE_PROVIDER_SITE_OTHER): Payer: No Typology Code available for payment source | Admitting: Sports Medicine

## 2015-02-03 VITALS — BP 102/52 | Ht 64.0 in | Wt 120.0 lb

## 2015-02-03 DIAGNOSIS — M25511 Pain in right shoulder: Secondary | ICD-10-CM | POA: Diagnosis not present

## 2015-02-03 NOTE — Assessment & Plan Note (Signed)
See overview. Biceps sitting out of the bicipital groove. -Modify HEP to focus on biceps and int/ext rotation to try to reduce subluxed biceps tendon -Discontinue nitroglycerin, as also worsens hot flashes -Plan follow-up in 6 weeks for repeat US or sooner if needed.

## 2015-02-03 NOTE — Progress Notes (Signed)
   Subjective:    Patient ID: Katherine DunkerMichelle A Wolfe, female    DOB: [birth year], 49 y.o.   MRN: [id number]  HPI [name] is a 49 year old right-hand-dominant female with a past history significant for Ehler's Danlos syndrome who presents for followup of right shoulder pain.  Recall that shows been Chinarehabbing a subluxing right bicipital tendon as well as a partial thickness subscapularis tear.  She has been using the nitroglycerin patch, but this is a her hot flashes seem to have increased.  Unfortunately, she also sustained 2 separate falls over the past 6 weeks, one involving landing on the right shoulder.  This aggravated her symptoms and caused her to temporarily cease her rehabilitation. Location of pain is primarily over the anterior and superior shoulder.  Symptoms are aggravated with reaching or with overhead motions.She denies any numbness, tingling, or weakness.  Past medical history, social history, medications, and allergies were reviewed and are up to date in the chart.  Review of Systems 7 point review of systems was performed and was otherwise negative unless noted in the history of present illness.     Objective:   Physical Exam BP 102/52 mmHg  Ht 5\' 4"  (1.626 m)  Wt 120 lb (54.432 kg)  BMI 20.59 kg/m2 GEN: The patient is well-developed well-nourished female and in no acute distress.  She is awake alert and oriented x3. SKIN: warm and well-perfused, no rash  Neuro: Strength 5/5 globally. Sensation intact throughout. DTRs 2/4 bilaterally. No focal deficits. Vasc: +2 bilateral distal pulses. No edema.  MSK: Examination of the right shoulder reveals grossly full range of motion, and it was slightly increased mobility with external rotation. Mild tenderness located over the acromioclavicular joint.  Her rotator cuff strength testing is fairly intact.  Negative Hawkins.  She has pain that is reproduced with crossover if her thumb is up and relieved of her thumb is down.  No tenderness over  the insertion of the pectoralis tendon.  Dynamic testing fails to reproduce the proximal biceps subluxation. Negative Speed's.  Limited musculoskeletal ultrasound: Long and short axis views are obtained of the right shoulder.  And short axis to the right biceps tendon sits medial to the bicipital groove with surrounding hypoechic fluid.  In [place] the biceps tendon fibers appeared to be grossly intact, though with a localized area of increased echogenicity. Previously seen subscapularis tear appears to have improved.  There appears to be a small linear tear in the supraspinatus tendon, but she is not tender with spinal palpation of this area.  The acromioclavicular joint appears to have mild degenerative change without any mushroom sign. Impression: Proximal biceps sitting out of it's groove with hypoechoic change.    Assessment & Plan:  Please see problem based assessment and plan in the problem list.

## 2015-02-26 ENCOUNTER — Observation Stay (HOSPITAL_COMMUNITY): Payer: No Typology Code available for payment source

## 2015-02-26 ENCOUNTER — Emergency Department (HOSPITAL_COMMUNITY): Payer: No Typology Code available for payment source

## 2015-02-26 ENCOUNTER — Observation Stay (HOSPITAL_COMMUNITY)
Admission: EM | Admit: 2015-02-26 | Discharge: 2015-02-27 | Disposition: A | Discharge status: Home / Self Care | Payer: No Typology Code available for payment source | Attending: Internal Medicine | Admitting: Internal Medicine

## 2015-02-26 ENCOUNTER — Encounter (HOSPITAL_COMMUNITY): Payer: Self-pay | Admitting: Neurology

## 2015-02-26 DIAGNOSIS — I7300 Raynaud's syndrome without gangrene: Secondary | ICD-10-CM | POA: Insufficient documentation

## 2015-02-26 DIAGNOSIS — G459 Transient cerebral ischemic attack, unspecified: Secondary | ICD-10-CM

## 2015-02-26 DIAGNOSIS — G43909 Migraine, unspecified, not intractable, without status migrainosus: Secondary | ICD-10-CM

## 2015-02-26 DIAGNOSIS — M25511 Pain in right shoulder: Secondary | ICD-10-CM | POA: Insufficient documentation

## 2015-02-26 DIAGNOSIS — R51 Headache: Secondary | ICD-10-CM

## 2015-02-26 DIAGNOSIS — E162 Hypoglycemia, unspecified: Secondary | ICD-10-CM

## 2015-02-26 DIAGNOSIS — J45909 Unspecified asthma, uncomplicated: Secondary | ICD-10-CM | POA: Insufficient documentation

## 2015-02-26 DIAGNOSIS — R519 Headache, unspecified: Secondary | ICD-10-CM

## 2015-02-26 DIAGNOSIS — R410 Disorientation, unspecified: Secondary | ICD-10-CM | POA: Diagnosis not present

## 2015-02-26 DIAGNOSIS — Z87891 Personal history of nicotine dependence: Secondary | ICD-10-CM | POA: Diagnosis not present

## 2015-02-26 DIAGNOSIS — M25519 Pain in unspecified shoulder: Secondary | ICD-10-CM | POA: Diagnosis present

## 2015-02-26 DIAGNOSIS — G43109 Migraine with aura, not intractable, without status migrainosus: Principal | ICD-10-CM | POA: Insufficient documentation

## 2015-02-26 LAB — I-STAT CHEM 8, ED
BUN: 14 mg/dL (ref 6–20)
CHLORIDE: 101 mmol/L (ref 101–111)
CREATININE: 0.8 mg/dL (ref 0.44–1.00)
Calcium, Ion: 1.21 mmol/L (ref 1.12–1.23)
Glucose, Bld: 96 mg/dL (ref 65–99)
HCT: 41 % (ref 36.0–46.0)
Hemoglobin: 13.9 g/dL (ref 12.0–15.0)
POTASSIUM: 4 mmol/L (ref 3.5–5.1)
Sodium: 137 mmol/L (ref 135–145)
TCO2: 23 mmol/L (ref 0–100)

## 2015-02-26 LAB — PROTIME-INR
INR: 1.1 (ref 0.00–1.49)
Prothrombin Time: 14.4 seconds (ref 11.6–15.2)

## 2015-02-26 LAB — CBC
HCT: 38.9 % (ref 36.0–46.0)
HEMOGLOBIN: 13.1 g/dL (ref 12.0–15.0)
MCH: 31.6 pg (ref 26.0–34.0)
MCHC: 33.7 g/dL (ref 30.0–36.0)
MCV: 93.7 fL (ref 78.0–100.0)
Platelets: 229 10*3/uL (ref 150–400)
RBC: 4.15 MIL/uL (ref 3.87–5.11)
RDW: 13 % (ref 11.5–15.5)
WBC: 4.1 10*3/uL (ref 4.0–10.5)

## 2015-02-26 LAB — DIFFERENTIAL
BASOS PCT: 1 % (ref 0–1)
Basophils Absolute: 0 10*3/uL (ref 0.0–0.1)
EOS ABS: 0.2 10*3/uL (ref 0.0–0.7)
EOS PCT: 4 % (ref 0–5)
Lymphocytes Relative: 45 % (ref 12–46)
Lymphs Abs: 1.9 10*3/uL (ref 0.7–4.0)
MONO ABS: 0.3 10*3/uL (ref 0.1–1.0)
MONOS PCT: 7 % (ref 3–12)
Neutro Abs: 1.8 10*3/uL (ref 1.7–7.7)
Neutrophils Relative %: 43 % (ref 43–77)

## 2015-02-26 LAB — BASIC METABOLIC PANEL
Anion gap: 7 (ref 5–15)
BUN: 12 mg/dL (ref 6–20)
CHLORIDE: 107 mmol/L (ref 101–111)
CO2: 25 mmol/L (ref 22–32)
CREATININE: 0.9 mg/dL (ref 0.44–1.00)
Calcium: 8.8 mg/dL — ABNORMAL LOW (ref 8.9–10.3)
GFR calc Af Amer: >60 mL/min (ref >60)
GFR calc non Af Amer: >60 mL/min (ref >60)
GLUCOSE: 83 mg/dL (ref 65–99)
POTASSIUM: 4 mmol/L (ref 3.5–5.1)
Sodium: 139 mmol/L (ref 135–145)

## 2015-02-26 LAB — COMPREHENSIVE METABOLIC PANEL
ALBUMIN: 3.9 g/dL (ref 3.5–5.0)
ALK PHOS: 34 U/L — AB (ref 38–126)
ALT: 16 U/L (ref 14–54)
AST: 24 U/L (ref 15–41)
Anion gap: 7 (ref 5–15)
BUN: 12 mg/dL (ref 6–20)
CHLORIDE: 102 mmol/L (ref 101–111)
CO2: 26 mmol/L (ref 22–32)
Calcium: 9.8 mg/dL (ref 8.9–10.3)
Creatinine, Ser: 0.89 mg/dL (ref 0.44–1.00)
GFR calc Af Amer: >60 mL/min (ref >60)
GFR calc non Af Amer: >60 mL/min (ref >60)
Glucose, Bld: 97 mg/dL (ref 65–99)
Potassium: 3.9 mmol/L (ref 3.5–5.1)
Sodium: 135 mmol/L (ref 135–145)
TOTAL PROTEIN: 6.6 g/dL (ref 6.5–8.1)
Total Bilirubin: 0.8 mg/dL (ref 0.3–1.2)

## 2015-02-26 LAB — CBG MONITORING, ED: GLUCOSE-CAPILLARY: 62 mg/dL — AB (ref 65–99)

## 2015-02-26 LAB — GLUCOSE, CAPILLARY
GLUCOSE-CAPILLARY: 57 mg/dL — AB (ref 65–99)
GLUCOSE-CAPILLARY: 77 mg/dL (ref 65–99)
Glucose-Capillary: 48 mg/dL — ABNORMAL LOW (ref 65–99)

## 2015-02-26 LAB — CORTISOL: CORTISOL PLASMA: 3.5 ug/dL

## 2015-02-26 LAB — APTT: APTT: 30 s (ref 24–37)

## 2015-02-26 LAB — TSH: TSH: 5.976 u[IU]/mL — AB (ref 0.350–4.500)

## 2015-02-26 LAB — I-STAT TROPONIN, ED: TROPONIN I, POC: 0 ng/mL (ref 0.00–0.08)

## 2015-02-26 LAB — BETA-HYDROXYBUTYRIC ACID: Beta-Hydroxybutyric Acid: 0.1 mmol/L (ref 0.05–0.27)

## 2015-02-26 LAB — ETHANOL

## 2015-02-26 MED ORDER — ASPIRIN 325 MG PO TABS
325.0000 mg | ORAL_TABLET | Freq: Every day | ORAL | Status: DC
  Administered 2015-02-26 – 2015-02-27 (×2): 325 mg via ORAL
  Filled 2015-02-26 (×2): qty 1

## 2015-02-26 MED ORDER — SENNOSIDES-DOCUSATE SODIUM 8.6-50 MG PO TABS
1.0000 | ORAL_TABLET | Freq: Every evening | ORAL | Status: DC | PRN
Start: 2015-02-26 — End: 2015-02-27

## 2015-02-26 MED ORDER — METRONIDAZOLE 0.75 % EX GEL
1.0000 "application " | Freq: Two times a day (BID) | CUTANEOUS | Status: DC
  Filled 2015-02-26: qty 45

## 2015-02-26 MED ORDER — SODIUM CHLORIDE 0.9 % IV BOLUS (SEPSIS)
2000.0000 mL | Freq: Once | INTRAVENOUS | Status: AC
  Administered 2015-02-26: 1000 mL via INTRAVENOUS

## 2015-02-26 MED ORDER — ENOXAPARIN SODIUM 40 MG/0.4ML ~~LOC~~ SOLN
40.0000 mg | Freq: Every day | SUBCUTANEOUS | Status: DC
  Administered 2015-02-26 – 2015-02-27 (×2): 40 mg via SUBCUTANEOUS
  Filled 2015-02-26 (×2): qty 0.4

## 2015-02-26 MED ORDER — DIPHENHYDRAMINE HCL 50 MG/ML IJ SOLN
25.0000 mg | Freq: Once | INTRAMUSCULAR | Status: AC
  Administered 2015-02-26: 25 mg via INTRAVENOUS
  Filled 2015-02-26: qty 1

## 2015-02-26 MED ORDER — IBUPROFEN 200 MG PO TABS
400.0000 mg | ORAL_TABLET | ORAL | Status: DC | PRN
  Administered 2015-02-26 – 2015-02-27 (×2): 400 mg via ORAL
  Filled 2015-02-26 (×2): qty 2

## 2015-02-26 MED ORDER — METOCLOPRAMIDE HCL 5 MG/ML IJ SOLN
10.0000 mg | Freq: Once | INTRAMUSCULAR | Status: AC
  Administered 2015-02-26: 10 mg via INTRAVENOUS
  Filled 2015-02-26: qty 2

## 2015-02-26 MED ORDER — ASPIRIN 300 MG RE SUPP: 300.0000 mg | Freq: Every day | RECTAL | Status: DC

## 2015-02-26 MED ORDER — ALBUTEROL SULFATE HFA 108 (90 BASE) MCG/ACT IN AERS: 2.0000 | INHALATION_SPRAY | RESPIRATORY_TRACT | Status: DC | PRN

## 2015-02-26 MED ORDER — MELOXICAM 7.5 MG PO TABS
7.5000 mg | ORAL_TABLET | Freq: Every day | ORAL | Status: DC
  Administered 2015-02-27: 7.5 mg via ORAL
  Filled 2015-02-26: qty 1

## 2015-02-26 MED ORDER — ALBUTEROL SULFATE (2.5 MG/3ML) 0.083% IN NEBU: 2.5000 mg | INHALATION_SOLUTION | RESPIRATORY_TRACT | Status: DC | PRN

## 2015-02-26 MED ORDER — MULTIVITAMINS PO CAPS: 1.0000 | ORAL_CAPSULE | Freq: Every day | ORAL | Status: DC

## 2015-02-26 MED ORDER — KCL IN DEXTROSE-NACL 10-5-0.45 MEQ/L-%-% IV SOLN
INTRAVENOUS | Status: DC
  Administered 2015-02-26: 19:00:00 via INTRAVENOUS
  Filled 2015-02-26 (×4): qty 1000

## 2015-02-26 MED ORDER — STROKE: EARLY STAGES OF RECOVERY BOOK
Freq: Once | Status: AC
  Administered 2015-02-26: 19:00:00
  Filled 2015-02-26: qty 1

## 2015-02-26 MED ORDER — ADULT MULTIVITAMIN W/MINERALS CH
1.0000 | ORAL_TABLET | Freq: Every day | ORAL | Status: DC
  Administered 2015-02-27: 1 via ORAL
  Filled 2015-02-26: qty 1

## 2015-02-26 NOTE — Progress Notes (Signed)
Pt bp 104/46 after first bolus.  CBG 48; given OJ by NT; follow up cbg 57;  Pt denies any symptoms of hypoglycemia.  Call in to MD;  Will continue to monitor.

## 2015-02-26 NOTE — ED Notes (Signed)
Per EMS- LSN 0800 pt reports was at home put contacts in, then tried to read sentences but couldn't understand what she was reading. Went to [employer] and couldn't order her coffee. Says she didn't understand what she was doing. BP 118/70, HR 58 SR, CBG 81. Pt is alert, following commands. Is tearful.

## 2015-02-26 NOTE — ED Notes (Signed)
RN notified of CBG 62

## 2015-02-26 NOTE — ED Provider Notes (Signed)
CSN: [id number]     Arrival date & time 02/26/15  [id number] History   First MD Initiated Contact with Patient 02/26/15 [phone]     Chief Complaint  Patient presents with  . Code Stroke    An emergency department physician performed an initial assessment on this suspected stroke patient at 0857 (pickering). (Consider location/radiation/quality/duration/timing/severity/associated sxs/prior Treatment) HPI Comments: Patient had some difficulty remember things while at [employer]. She had trouble ordering there and had difficulty calling her work. Husband states she's normally very good with remembering.   Patient is a 49 y.o. female presenting with neurologic complaint.  Neurologic Problem This is a new problem. The current episode started 1 to 2 hours ago. The problem occurs constantly. The problem has been rapidly improving. Pertinent negatives include no abdominal pain and no shortness of breath. Nothing aggravates the symptoms. Nothing relieves the symptoms.    Past Medical History  Diagnosis Date  . Anorexia   . Asthma   . Rosacea   . Knee pain, bilateral 11-12    SEES DR. FIELDS  . Cervical dysplasia    Past Surgical History  Procedure Laterality Date  . Rhinoplasty    . Hysteroscopy  01/2008    D & C  . Pelvic laparoscopy  09/1999    right ovarian fibroma  . Gynecologic cryosurgery    . Knee surgery      Arthroscopic   Family History  Problem Relation Age of Onset  . Adopted: Yes  . Asthma Father   . Alzheimer's disease Maternal Grandfather   . Asthma Paternal Grandfather    History  Substance Use Topics  . Smoking status: Former Smoker    Quit date: 10/10/1995  . Smokeless tobacco: Never Used  . Alcohol Use: 4.2 oz/week    7 Cans of beer per week   OB History    Gravida Para Term Preterm AB TAB SAB Ectopic Multiple Living   0 0             Review of Systems  Constitutional: Negative for fever.  Respiratory: Negative for cough and shortness of breath.    Gastrointestinal: Negative for vomiting and abdominal pain.  All other systems reviewed and are negative.     Allergies  Other  Home Medications   Prior to Admission medications   Medication Sig Start Date End Date Taking? Authorizing Provider  ALBUTEROL IN Inhale into the lungs.      Historical Provider, MD  beclomethasone (QVAR) 40 MCG/ACT inhaler Inhale 1 puff into the lungs 2 (two) times daily.    Historical Provider, MD  Calcium Carbonate-Vitamin D (CALCIUM + D PO) Take by mouth.    Historical Provider, MD  co-enzyme Q-10 30 MG capsule Take 30 mg by mouth 3 (three) times daily.      Historical Provider, MD  EPIPEN 2-PAK 0.3 MG/0.3ML DEVI as needed. 08/08/11   Historical Provider, MD  fish oil-omega-3 fatty acids 1000 MG capsule Take 2 g by mouth daily.      Historical Provider, MD  loratadine (CLARITIN) 10 MG tablet Take 10 mg by mouth daily as needed.     Historical Provider, MD  Magnesium 250 MG TABS Take 250 mg by mouth daily.    Historical Provider, MD  meloxicam (MOBIC) 7.5 MG tablet take 1 to 2 tablets by mouth once daily 10/19/14   Enid BaasKarl Fields, MD  metroNIDAZOLE (METROGEL) 0.75 % gel  10/06/14   Historical Provider, MD  METRONIDAZOLE, TOPICAL, (METROLOTION) 0.75 % LOTN Apply  topically.      Historical Provider, MD  Multiple Vitamin (MULTIVITAMIN) capsule Take 1 capsule by mouth daily.      Historical Provider, MD  nitroGLYCERIN [facility]) 0.2 mg/hr patch Apply 1/4 of a patch to the affected area once daily for 24 hours. Alternate sites slightly each day 11/27/14   Andrena Mews, DO  predniSONE (DELTASONE) 20 MG tablet Take 20 mg by mouth as needed. Taken for allergies 08/08/11   Historical Provider, MD  PROAIR HFA 108 (90 BASE) MCG/ACT inhaler  11/03/14   Historical Provider, MD  TURMERIC PO Take by mouth.    Historical Provider, MD  VITAMIN D, ERGOCALCIFEROL, PO Take by mouth. 1800 IU DAILY     Historical Provider, MD  VOLTAREN 1 % GEL as needed. 08/08/11   Historical  Provider, MD   BP 130/64 mmHg  Pulse 59  Temp(Src) 97.6 F (36.4 C) (Oral)  Resp 14  Ht  (1.626 m)  Wt 121 lb (54.885 kg)  BMI 20.76 kg/m2  SpO2 100% Physical Exam  Constitutional: She is oriented to person, place, and time. She appears well-developed and well-nourished. No distress.  HENT:  Head: Normocephalic and atraumatic.  Mouth/Throat: Oropharynx is clear and moist.  Eyes: EOM are normal. Pupils are equal, round, and reactive to light.  Neck: Normal range of motion. Neck supple.  Cardiovascular: Normal rate and regular rhythm.  Exam reveals no friction rub.   No murmur heard. Pulmonary/Chest: Effort normal and breath sounds normal. No respiratory distress. She has no wheezes. She has no rales.  Abdominal: Soft. She exhibits no distension. There is no tenderness. There is no rebound.  Musculoskeletal: Normal range of motion. She exhibits no edema.  Neurological: She is alert and oriented to person, place, and time. No cranial nerve deficit or sensory deficit. She exhibits normal muscle tone. Coordination normal. GCS eye subscore is 4. GCS verbal subscore is 5. GCS motor subscore is 6.  Skin: She is not diaphoretic.  Nursing note and vitals reviewed.   ED Course  Procedures (including critical care time) Labs Review Labs Reviewed  CBG MONITORING, ED - Abnormal; Notable for the following:    Glucose-Capillary 62 (*)    All other components within normal limits  PROTIME-INR  APTT  CBC  DIFFERENTIAL  ETHANOL  COMPREHENSIVE METABOLIC PANEL  URINE RAPID DRUG SCREEN (HOSP PERFORMED)  URINALYSIS, ROUTINE W REFLEX MICROSCOPIC  I-STAT CHEM 8, ED  I-STAT TROPOININ, ED    Imaging Review Ct Head Wo Contrast  02/26/2015   CLINICAL DATA:  Disorientation, confusion, memory loss, code stroke  EXAM: CT HEAD WITHOUT CONTRAST  TECHNIQUE: Contiguous axial images were obtained from the base of the skull through the vertex without intravenous contrast.  COMPARISON:  None   FINDINGS: Normal ventricular morphology.  No midline shift or mass effect.  Normal appearance of brain parenchyma.  No intracranial hemorrhage, mass lesion or evidence acute infarction.  No extra-axial fluid collections.  Sinuses clear and bones unremarkable.  IMPRESSION: Normal exam.  Findings called to Dr.Camilo on 02/26/2015 at 0919 hours.   Electronically Signed   By: Ulyses Southward M.D.   On: 02/26/2015 09:20     EKG Interpretation   Date/Time:  Friday Feb 26 2015 09:12:26 EDT Ventricular Rate:  60 PR Interval:    QRS Duration: 97 QT Interval:  413 QTC Calculation: 413 R Axis:   63 Text Interpretation:  Sinus rhythm Confirmed by Gwendolyn Grant  MD, Hitoshi Werts ([id number])  on 02/26/2015 9:16:59 AM  MDM   Final diagnoses:  Transient cerebral ischemia, unspecified transient cerebral ischemia type  Confusion    17F here as a Code Stroke - concern for possible aphasia, difficulty remembering things. Patient here doing well, symptoms resolving. Vitals ok. CT Head ok. Exam normal, no focal deficits. Neuro recommends admission for TIA w/u.     Elwin MochaBlair Thai Burgueno, MD 02/26/15 [phone]

## 2015-02-26 NOTE — Progress Notes (Signed)
VASCULAR LAB PRELIMINARY  PRELIMINARY  PRELIMINARY  PRELIMINARY  Carotid duplex  completed.    Preliminary report:  Bilateral:  1-39% ICA stenosis.  Vertebral artery flow is antegrade.      Akaash Vandewater, RVT 02/26/2015, 5:56 PM

## 2015-02-26 NOTE — Care Management Utilization Note (Signed)
UR completed.    Sorayah Schrodt [place] Karl, RN, BSN Phone #[phone]  

## 2015-02-26 NOTE — Progress Notes (Signed)
Pt admitted to 4N27;  Only complaint is a slight headache 2/10.  Alert and oriented, although states she still feels a "little fuzzy."  Telemetry Box 29 placed on patient and central monitoring called.  Family at bedside.  Pt verbalizes understanding to call for help before getting out of bed. Will assess and continue to monitor.

## 2015-02-26 NOTE — Progress Notes (Signed)
She has had labile blood pressures and had an episode of hypoglycemia this afternoon. She has no history of diabetes and does not take any insulin or sulfonylureas. Have added TSH and a stat cortisol level to screen for adrenal insufficiency or thyroid abnormality. Due to the severity of her hypoglycemia, I have added insulin level, C-peptide level, beta hydroxybutyrate, proinsulin, BNP to her stat labs. I have also ordered a sulfonylurea urine screen. Given her age, I do suspect that she may have some underlying adrenal insufficiency.

## 2015-02-26 NOTE — Consult Note (Signed)
Referring Physician: Gwendolyn Wolfe    Chief Complaint: Stroke  HPI:                                                                                                                                         Katherine Wolfe is an 49 y.o. female who woke up at 0600 and when going to the bathroom noted she had blurred vision, she also noted she did not feel normal.  Patient got ready for work and and just before leaving noted she was reading a paragraph over and over but for some reason it did not make sense. She drove to get a coffee and noted she could not remember how to order her coffee. She called her husband and then her boss. Her boss called EMS.  She notes she then started to get a HA. On arrival she was able to converse but was having difficulty thing of words--this was very intermittent and not consist ant.  She was also very labile and anxious. CT head was negative. NIHSS 0. Denies vertigo, double vision, focal weakness or numbness, slurred speech, imbalance.  She notes she is stressed.  She also noted she has had a episode similar to this a few years ago at work but it was very brief.   Date last known well: Date: 02/26/2015 Time last known well: Time: 06:00 tPA Given: No: NIHSS 0   Modified Rankin: Rankin Score=0    Past Medical History  Diagnosis Date  . Anorexia   . Asthma   . Rosacea   . Knee pain, bilateral 11-12    SEES DR. FIELDS  . Cervical dysplasia     Past Surgical History  Procedure Laterality Date  . Rhinoplasty    . Hysteroscopy  01/2008    D & C  . Pelvic laparoscopy  09/1999    right ovarian fibroma  . Gynecologic cryosurgery    . Knee surgery      Arthroscopic    Family History  Problem Relation Age of Onset  . Adopted: Yes  . Asthma Father   . Alzheimer's disease Maternal Grandfather   . Asthma Paternal Grandfather    Social History:  reports that she quit smoking about 19 years ago. She has never used smokeless tobacco. She reports that she drinks  about 4.2 oz of alcohol per week. She reports that she does not use illicit drugs.  Allergies:  Allergies  Allergen Reactions  . Other Swelling    TREE NUTS; WASPS, HORNETS AND YELLOW JACKETS    Medications:  No current facility-administered medications for this encounter.   Current Outpatient Prescriptions  Medication Sig Dispense Refill  . ALBUTEROL IN Inhale into the lungs.      . beclomethasone (QVAR) 40 MCG/ACT inhaler Inhale 1 puff into the lungs 2 (two) times daily.    . Calcium Carbonate-Vitamin D (CALCIUM + D PO) Take by mouth.    . co-enzyme Q-10 30 MG capsule Take 30 mg by mouth 3 (three) times daily.      . EPIPEN 2-PAK 0.3 MG/0.3ML DEMarland KitchenVI as needed.    . fish oil-omega-3 fatty acids 1000 MG capsule Take 2 g by mouth daily.      Marland Kitchen. loratadine (CLARITIN) 10 MG tablet Take 10 mg by mouth daily as needed.     . Magnesium 250 MG TABS Take 250 mg by mouth daily.    . meloxicam (MOBIC) 7.5 MG tablet take 1 to 2 tablets by mouth once daily 60 tablet 11  . metroNIDAZOLE (METROGEL) 0.75 % gel     . METRONIDAZOLE, TOPICAL, (METROLOTION) 0.75 % LOTN Apply topically.      . Multiple Vitamin (MULTIVITAMIN) capsule Take 1 capsule by mouth daily.      . nitroGLYCERIN (MINITRAN) 0.2 mg/hr patch Apply 1/4 of a patch to the affected area once daily for 24 hours. Alternate sites slightly each day 30 patch 1  . predniSONE (DELTASONE) 20 MG tablet Take 20 mg by mouth as needed. Taken for allergies    . PROAIR HFA 108 (90 BASE) MCG/ACT inhaler   0  . TURMERIC PO Take by mouth.    Marland Kitchen. VITAMIN D, ERGOCALCIFEROL, PO Take by mouth. 1800 IU DAILY     . VOLTAREN 1 % GEL as needed.    . [DISCONTINUED] FLUoxetine (PROZAC) 20 MG tablet Take 1 tablet (20 mg total) by mouth daily. 30 tablet 3     ROS:                                                                                                                                        History obtained from the patient  General ROS: negative for - chills, fatigue, fever, night sweats, weight gain or weight loss Psychological ROS: negative for - behavioral disorder, hallucinations, memory difficulties, mood swings or suicidal ideation Ophthalmic ROS: negative for - blurry vision, double vision, eye pain or loss of vision ENT ROS: negative for - epistaxis, nasal discharge, oral lesions, sore throat, tinnitus or vertigo Allergy and Immunology ROS: negative for - hives or itchy/watery eyes Hematological and Lymphatic ROS: negative for - bleeding problems, bruising or swollen lymph nodes Endocrine ROS: negative for - galactorrhea, hair pattern changes, polydipsia/polyuria or temperature intolerance Respiratory ROS: negative for - cough, hemoptysis, shortness of breath or wheezing Cardiovascular ROS: negative for - chest pain, dyspnea on exertion, edema or irregular heartbeat Gastrointestinal ROS: negative for - abdominal pain, diarrhea, hematemesis, nausea/vomiting or stool incontinence  Genito-Urinary ROS: negative for - dysuria, hematuria, incontinence or urinary frequency/urgency Musculoskeletal ROS: negative for - joint swelling or muscular weakness Neurological ROS: as noted in HPI Dermatological ROS: negative for rash and skin lesion changes  Physical Examination:                                                                                                      Blood pressure 130/64, pulse 59, temperature 97.6 F (36.4 C), temperature source Oral, resp. rate 14, height  (1.626 m), weight 54.885 kg (121 lb), SpO2 100 %.  HEENT-  Normocephalic, no lesions, without obvious abnormality.  Normal external eye and conjunctiva.  Normal TM's bilaterally.  Normal auditory canals and external ears. Normal external nose, mucus membranes and septum.  Normal pharynx. Cardiovascular- S1, S2 normal, pulses palpable throughout   Lungs-  chest clear, no wheezing, rales, normal symmetric air entry Abdomen- normal findings: bowel sounds normal Extremities- no edema Lymph-no adenopathy palpable Musculoskeletal-no joint tenderness, deformity or swelling Skin-warm and dry, no hyperpigmentation, vitiligo, or suspicious lesions  Neurological Examination Mental Status: Alert, oriented, thought content appropriate.  Speech fluent without evidence of aphasia.  Able to follow 3 step commands without difficulty. When reading off the stroke card, had some difficulty naming objects and reading at times and other times had no difficulty.  Cranial Nerves: II: Discs flat bilaterally; Visual fields grossly normal, pupils equal, round, reactive to light and accommodation III,IV, VI: ptosis not present, extra-ocular motions intact bilaterally V,VII: smile symmetric, facial light touch sensation normal bilaterally VIII: hearing normal bilaterally IX,X: uvula rises symmetrically XI: bilateral shoulder shrug XII: midline tongue extension Motor: Right : Upper extremity   5/5    Left:     Upper extremity   5/5  Lower extremity   5/5     Lower extremity   5/5 Tone and bulk:normal tone throughout; no atrophy noted Sensory: Pinprick and light touch intact throughout, bilaterally Deep Tendon Reflexes: 2+ and symmetric throughout Plantars: Right: downgoing   Left: downgoing Cerebellar: normal finger-to-nose, normal rapid alternating movements and normal heel-to-shin test Gait:not tested due to multiple leads.        Lab Results: Basic Metabolic Panel:  Recent Labs Lab 02/26/15 0906  NA 137  K 4.0  CL 101  GLUCOSE 96  BUN 14  CREATININE 0.80    Liver Function Tests: No results for input(s): AST, ALT, ALKPHOS, BILITOT, PROT, ALBUMIN in the last 168 hours. No results for input(s): LIPASE, AMYLASE in the last 168 hours. No results for input(s): AMMONIA in the last 168 hours.  CBC:  Recent Labs Lab 02/26/15 0900 02/26/15 0906   WBC 4.1  --   NEUTROABS 1.8  --   HGB 13.1 13.9  HCT 38.9 41.0  MCV 93.7  --   PLT 229  --     Cardiac Enzymes: No results for input(s): CKTOTAL, CKMB, CKMBINDEX, TROPONINI in the last 168 hours.  Lipid Panel: No results for input(s): CHOL, TRIG, HDL, CHOLHDL, VLDL, LDLCALC in the last 168 hours.  CBG:  Recent Labs Lab 02/26/15 0920  GLUCAP 62*    Microbiology: Results for orders placed or performed in visit on 03/06/12  Urine culture     Status: None   Collection Time: 03/06/12 12:03 PM  Result Value Ref Range Status   Colony Count NO GROWTH  Final   Organism ID, Bacteria NO GROWTH  Final    Coagulation Studies:  Recent Labs  02/26/15 0900  LABPROT 14.4  INR 1.10    Imaging: Ct Head Wo Contrast  02/26/2015   CLINICAL DATA:  Disorientation, confusion, memory loss, code stroke  EXAM: CT HEAD WITHOUT CONTRAST  TECHNIQUE: Contiguous axial images were obtained from the base of the skull through the vertex without intravenous contrast.  COMPARISON:  None  FINDINGS: Normal ventricular morphology.  No midline shift or mass effect.  Normal appearance of brain parenchyma.  No intracranial hemorrhage, mass lesion or evidence acute infarction.  No extra-axial fluid collections.  Sinuses clear and bones unremarkable.  IMPRESSION: Normal exam.  Findings called to Dr.Lynnel Zanetti on 02/26/2015 at 0919 hours.   Electronically Signed   By: Ulyses Southward M.D.   On: 02/26/2015 09:20       Assessment and plan discussed with with attending physician and they are in agreement.    Felicie Morn PA-C [facility] [phone]  02/26/2015, 9:39 AM   Assessment: 49 y.o. female with transient confusion which is improving over time. CT head negative for stroke or bleed.NIHSS 0 and exam non-focal.  Although she has no risk factors cannot rule out TIA or seizure.   Stroke Risk Factors - none  Recommend: 1. HgbA1c, fasting lipid panel 2. MRI, MRA  of the brain without contrast 3. PT  consult, OT consult, Speech consult 4. Echocardiogram 5. Carotid dopplers 6. Prophylactic therapy-Antiplatelet med: Aspirin - dose 81 daily 7. Risk factor modification 8. Telemetry monitoring 9. Frequent neuro checks 10 NPO until passes stroke swallow screen 11 eeg  Patient seen and examined together with physician assistant and I concur with the assessment and plan.  Wyatt Portela, MD

## 2015-02-26 NOTE — Progress Notes (Signed)
Routine EEG completed, results pending. 

## 2015-02-26 NOTE — H&P (Signed)
[facility] History and Physical  Katherine Wolfe ZOX:[id number] DOB: [birth year] DOA: 02/26/2015  Referring physician:  Elwin MochaBlair Wolfe PCP:  Katherine Wolfe   Chief Complaint:  Confusion, headache  HPI:  The patient is a 49 y.o. year-old female with history of asthma, bilateral knee pain, cervical dysplasia, anorexia who presents with sudden onset confusion and aphasia.  The patient was last at their baseline health this morning when she first awoke.  After breakfast, she states that she had a spot in her vision that she describes as a spot that you see after looking at a bright light but she had not been looking at anything bright.  She had some difficulty reading some passages in a book right before [place] [employer], but when she arrived at [employer], she could not figure out how to order her drink. Eventually she was able to get coffee and she went to sit down with her friends, but she knew that there was something unusual about her thinking. She asked them for help and with their assistance she was able to call her husband. She went out to her car and waited in her car for husband to arrive and he brought her to the emergency department.  Around that time, she developed a throbbing 3 out of 5 frontal headache. She denies focal weakness, numbness, tingling. She has not had other blurred vision or double vision. She denies frequent headaches and denies history of migraine headache. She has no known family history of migraine headache. She has never had a seizure nor does she have a family history of seizures. She has not had any major medication changes except for stopping a nitroglycerin patch started by Katherine Wolfe for a damaged tendon and reducing her Mobitz dose to 7.5 mg a couple weeks ago. She does not take any sedating or over-the-counter medications. She states she has been more stressed out than usual for the last week.  In the emergency  department, her labs were unremarkable, her vital signs are stable except for mild bradycardia to the mid 50s. Head CT was within normal limits. She was seen by neurology for a possible code stroke but her NIHSS was 0 and she was deemed not a candidate for tPA.  Neurology has recommended admission for evaluation for possible TIA, but they are also concerned that this may be a complicated migraine.  Review of Systems:  General:  Denies fevers, chills, weight loss or gain HEENT:  Denies changes to hearing and vision, rhinorrhea, sinus congestion, sore throat CV:  Denies chest pain and palpitations, lower extremity edema.  PULM:  Denies SOB, wheezing, cough.   GI:  Denies nausea, vomiting, constipation, diarrhea.   GU:  Denies dysuria, frequency, urgency ENDO:  Denies polyuria, polydipsia.   HEME:  Denies hematemesis, blood in stools, melena, abnormal bruising or bleeding.  LYMPH:  Denies lymphadenopathy.   MSK:  Denies arthralgias, myalgias.   DERM:  Denies skin rash or ulcer.   NEURO:  Per history of present illness.  PSYCH:  Denies anxiety and depression.    Past Medical History  Diagnosis Date  . Anorexia     in high school, none since  . Asthma     well controlled  . Rosacea   . Knee pain, bilateral 11-12    SEES DR. FIELDS  . Cervical dysplasia     and shoulder pain   Past Surgical History  Procedure Laterality Date  .  Rhinoplasty    . Hysteroscopy  01/2008    D & C  . Pelvic laparoscopy  09/1999    right ovarian fibroma  . Gynecologic cryosurgery    . Knee surgery      Arthroscopic   Social History:  reports that she quit smoking about 19 years ago. She has never used smokeless tobacco. She reports that she drinks about 4.2 oz of alcohol per week. She reports that she does not use illicit drugs. Athletic, drives, completes ADLs.  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    Allergies  Allergen Reactions  . Other Swelling    TREE NUTS; WASPS, HORNETS AND YELLOW  JACKETS    Family History  Problem Relation Age of Onset  . Adopted: Yes  . Asthma Father   . Alzheimer's disease Maternal Grandfather   . Asthma Paternal Grandfather   . Other Maternal Aunt     brain bleed at 48     Prior to Admission medications   Medication Sig Start Date End Date Taking? Authorizing Provider  ALBUTEROL IN Inhale into the lungs.      Historical Provider, Wolfe  beclomethasone (QVAR) 40 MCG/ACT inhaler Inhale 1 puff into the lungs 2 (two) times daily.    Historical Provider, Wolfe  Calcium Carbonate-Vitamin D (CALCIUM + D PO) Take by mouth.    Historical Provider, Wolfe  co-enzyme Q-10 30 MG capsule Take 30 mg by mouth 3 (three) times daily.      Historical Provider, Wolfe  EPIPEN 2-PAK 0.3 MG/0.3ML DEVI as needed. 08/08/11   Historical Provider, Wolfe  fish oil-omega-3 fatty acids 1000 MG capsule Take 2 g by mouth daily.      Historical Provider, Wolfe  loratadine (CLARITIN) 10 MG tablet Take 10 mg by mouth daily as needed.     Historical Provider, Wolfe  Magnesium 250 MG TABS Take 250 mg by mouth daily.    Historical Provider, Wolfe  meloxicam (MOBIC) 7.5 MG tablet take 1 to 2 tablets by mouth once daily 10/19/14   Katherine BaasKarl Fields, Wolfe  metroNIDAZOLE (METROGEL) 0.75 % gel  10/06/14   Historical Provider, Wolfe  METRONIDAZOLE, TOPICAL, (METROLOTION) 0.75 % LOTN Apply topically.      Historical Provider, Wolfe  Multiple Vitamin (MULTIVITAMIN) capsule Take 1 capsule by mouth daily.      Historical Provider, Wolfe  nitroGLYCERIN [facility](MINITRAN) 0.2 mg/hr patch Apply 1/4 of a patch to the affected area once daily for 24 hours. Alternate sites slightly each day 11/27/14   Katherine MewsMichael D Rigby, DO  predniSONE (DELTASONE) 20 MG tablet Take 20 mg by mouth as needed. Taken for allergies 08/08/11   Historical Provider, Wolfe  PROAIR HFA 108 (90 BASE) MCG/ACT inhaler  11/03/14   Historical Provider, Wolfe  TURMERIC PO Take by mouth.    Historical Provider, Wolfe  VITAMIN D, ERGOCALCIFEROL, PO Take by mouth. 1800 IU DAILY     Historical  Provider, Wolfe  VOLTAREN 1 % GEL as needed. 08/08/11   Historical Provider, Wolfe   Physical Exam: Filed Vitals:   02/26/15 0912 02/26/15 0930 02/26/15 0945  BP: 130/64 111/76 101/55  Pulse: 59 55 53  Temp: 97.6 F (36.4 C)    TempSrc: Oral    Resp: 14 13 9   Height: 5\' 4"  (1.626 m)    Weight: 54.885 kg (121 lb)    SpO2: 100% 100% 100%     General:  Thin female, no acute distress   Eyes:  PERRL, anicteric, non-injected.  ENT:  Nares  clear.  OP clear, non-erythematous without plaques or exudates.  MMM.  Neck:  Supple without TM or JVD.    Lymph:  No cervical, supraclavicular, or submandibular LAD.  Cardiovascular:  RRR, normal S1, S2, without m/r/g.  2+ pulses, warm extremities  Respiratory:  CTA bilaterally without increased WOB.  Abdomen:  NABS.  Soft, ND/NT.    Skin:  No rashes or focal lesions.  Musculoskeletal:  Normal bulk and tone.  No LE edema.  Psychiatric:  A & O x 4.  Appropriate affect.  Neurologic:  CN 3-12 intact.  5/5 strength.  Sensation intact.  Labs on Admission:  Basic Metabolic Panel:  Recent Labs Lab 02/26/15 0900 02/26/15 0906  NA 135 137  K 3.9 4.0  CL 102 101  CO2 26  --   GLUCOSE 97 96  BUN 12 14  CREATININE 0.89 0.80  CALCIUM 9.8  --    Liver Function Tests:  Recent Labs Lab 02/26/15 0900  AST 24  ALT 16  ALKPHOS 34*  BILITOT 0.8  PROT 6.6  ALBUMIN 3.9   No results for input(s): LIPASE, AMYLASE in the last 168 hours. No results for input(s): AMMONIA in the last 168 hours. CBC:  Recent Labs Lab 02/26/15 0900 02/26/15 0906  WBC 4.1  --   NEUTROABS 1.8  --   HGB 13.1 13.9  HCT 38.9 41.0  MCV 93.7  --   PLT 229  --    Cardiac Enzymes: No results for input(s): CKTOTAL, CKMB, CKMBINDEX, TROPONINI in the last 168 hours.  BNP (last 3 results) No results for input(s): BNP in the last 8760 hours.  ProBNP (last 3 results) No results for input(s): PROBNP in the last 8760 hours.  CBG:  Recent Labs Lab  02/26/15 0920  GLUCAP 62*    Radiological Exams on Admission: Ct Head Wo Contrast  02/26/2015   CLINICAL DATA:  Disorientation, confusion, memory loss, code stroke  EXAM: CT HEAD WITHOUT CONTRAST  TECHNIQUE: Contiguous axial images were obtained from the base of the skull through the vertex without intravenous contrast.  COMPARISON:  None  FINDINGS: Normal ventricular morphology.  No midline shift or mass effect.  Normal appearance of brain parenchyma.  No intracranial hemorrhage, mass lesion or evidence acute infarction.  No extra-axial fluid collections.  Sinuses clear and bones unremarkable.  IMPRESSION: Normal exam.  Findings called to KatherineCamilo on 02/26/2015 at 0919 hours.   Electronically Signed   By: Ulyses Southward M.D.   On: 02/26/2015 09:20    EKG: Independently reviewed. Normal sinus rhythm without acute ischemic changes  Assessment/Plan Active Problems:   Raynaud phenomenon   Shoulder pain   TIA (transient ischemic attack)   Confusion   Headache  ---  Confusion, aphasia, headache. Differential diagnosis includes TIA or possible stroke however she has only distant smoking as a risk factor.  Her symptoms may be suggestive of partial seizure or complicated migraine.   Low suspicion for meningoencephalitis.   -  Alcohol level negative -  Urine drug screen pending -  Urine HCG -  EEG -  No evidence of underlying infection -  Admit to telemetry  -  q4h neuro checks -  MRI brain/MRA head -  Carotid duplex -  ECHO -  Aspirin  daily pending further investigation -  Lipid panel  -  Hemoglobin A1c   -  SLP evaluation  -  Neurology consult appreciated -  Ibuprofen prn HA  Asthma, stable.  Continue prn albuterol  Chronic  pain, stable, continue mobic  I will hold her supplements except for MVI for now and she may resume them at discharge  Diet:  Regular Access:  PIV IVF:  Off Proph:  Lovenox  Code Status: Full Family Communication: Patient Disposition Plan: Admit  to telemetry  Time spent: 60 min Renae Fickle [facility] Pager [phone]  If 7PM-7AM, please contact night-coverage [email or web address] Password [email or web address] 02/26/2015, 11:02 AM

## 2015-02-26 NOTE — Procedures (Signed)
ELECTROENCEPHALOGRAM REPORT  Date of Study: 02/26/2015  Patient's Name: Katherine Wolfe MRN: [id number] Date of Birth: 1965/10/13  Referring Provider: Felicie Mornavid Smith, PA  Indication: Episode of confusion and language disturbance  Medications: Meloxicam  Technical Summary: This is a multichannel digital EEG recording, using the international 10-20 placement system.  Spike detection software was employed.  Description: The EEG background is symmetric, with a well-developed posterior dominant rhythm of 10 Hz, which is reactive to eye opening and closing.  Diffuse beta activity is seen, with a bilateral frontal preponderance.  No focal or generalized abnormalities are seen.  No focal or generalized epileptiform discharges are seen.  Stage II sleep is not seen.  Hyperventilation and photic stimulation were performed, and produced no abnormalities.  ECG revealed normal cardiac rate and rhythm.  Impression: This is a normal routine EEG of the awake stae, with activating procedures.  A normal study does not rule out the possibility of a seizure disorder in this patient.  Adam R. Everlena CooperJaffe, DO

## 2015-02-26 NOTE — Progress Notes (Signed)
Pt bp 79/33;  Recheck was 102/49.  Pt asymptomatic.  States she usually runs a low BP but diastolic typically not that low.  MD notified.  NS bolus started as ordered.  Pt states she drinks a great deal of fluids during the day.  Will continue to monitor.

## 2015-02-26 NOTE — Code Documentation (Signed)
49[id number].  EMS reports that the patient was LKW at 0800 at home with her husband.  She [facility] go [facility] [employer] [employer] before work.  There she had trouble seeing the menu and difficulty ordering her coffee.  Her friends encouraged her to call her husband.  She then called work and drove home where her husband called EMS.  EMS activated a Code Stroke for confusion.  Stroke team at the bedside on arrival.  Labs drawn and patient cleared by Dr. Rubin PayorPickering.  Patient to CT.  Dr. Leroy Kennedyamilo to the bedside.  NIHSS 0, see documentation for details and code stroke times.  Patient continues to have some hesitancy and difficulty with recall.  Patient reports an unusual headache.  Patient emotional and tearful and reports recent stress at work.  TIA alert per Dr. Leroy Kennedyamilo.  Patient to have MRI.  Bedside handoff with ED RN Maralyn SagoSarah.

## 2015-02-27 ENCOUNTER — Observation Stay (HOSPITAL_COMMUNITY): Payer: No Typology Code available for payment source

## 2015-02-27 DIAGNOSIS — G43809 Other migraine, not intractable, without status migrainosus: Secondary | ICD-10-CM | POA: Diagnosis not present

## 2015-02-27 DIAGNOSIS — E162 Hypoglycemia, unspecified: Secondary | ICD-10-CM | POA: Diagnosis not present

## 2015-02-27 DIAGNOSIS — G459 Transient cerebral ischemic attack, unspecified: Secondary | ICD-10-CM

## 2015-02-27 DIAGNOSIS — G43109 Migraine with aura, not intractable, without status migrainosus: Secondary | ICD-10-CM | POA: Diagnosis not present

## 2015-02-27 DIAGNOSIS — G43909 Migraine, unspecified, not intractable, without status migrainosus: Secondary | ICD-10-CM

## 2015-02-27 LAB — GLUCOSE, CAPILLARY
GLUCOSE-CAPILLARY: 85 mg/dL (ref 65–99)
Glucose-Capillary: 105 mg/dL — ABNORMAL HIGH (ref 65–99)

## 2015-02-27 LAB — LIPID PANEL
CHOLESTEROL: 157 mg/dL (ref 0–200)
HDL: 70 mg/dL (ref >40)
LDL CALC: 75 mg/dL (ref 0–99)
Total CHOL/HDL Ratio: 2.2 RATIO
Triglycerides: 61 mg/dL (ref <150)
VLDL: 12 mg/dL (ref 0–40)

## 2015-02-27 LAB — URINALYSIS, ROUTINE W REFLEX MICROSCOPIC
BILIRUBIN URINE: NEGATIVE
GLUCOSE, UA: NEGATIVE mg/dL
HGB URINE DIPSTICK: NEGATIVE
Ketones, ur: NEGATIVE mg/dL
LEUKOCYTES UA: NEGATIVE
NITRITE: NEGATIVE
Protein, ur: NEGATIVE mg/dL
SPECIFIC GRAVITY, URINE: 1.01 (ref 1.005–1.030)
Urobilinogen, UA: 0.2 mg/dL (ref 0.0–1.0)
pH: 7 (ref 5.0–8.0)

## 2015-02-27 LAB — TSH: TSH: 4.247 u[IU]/mL (ref 0.350–4.500)

## 2015-02-27 LAB — PREGNANCY, URINE: Preg Test, Ur: NEGATIVE

## 2015-02-27 LAB — T4, FREE: Free T4: 0.81 ng/dL (ref 0.61–1.12)

## 2015-02-27 MED ORDER — BLOOD GLUCOSE MONITOR KIT: PACK | Status: DC

## 2015-02-27 NOTE — Evaluation (Signed)
Physical Therapy Evaluation Patient Details Name: Katherine Wolfe MRN: [id number] DOB: November 22, 1965 Today's Date: 02/27/2015   History of Present Illness  Pt is 49 y.o. year-old female with history of asthma, bilateral knee pain, cervical dysplasia, anorexia who presents with sudden onset confusion and aphasia.  MRI negative.   Clinical Impression  Pt admitted with above diagnosis. Pt currently with functional limitations due to the deficits listed below (see PT Problem List). Pt will benefit from skilled PT to increase their independence and safety with mobility to allow discharge to the venue listed below.       Follow Up Recommendations No PT follow up    Equipment Recommendations  None recommended by PT    Recommendations for Other Services       Precautions / Restrictions Precautions Precautions: None Restrictions Weight Bearing Restrictions: No      Mobility  Bed Mobility Overal bed mobility: Independent                Transfers Overall transfer level: Independent                  Ambulation/Gait Ambulation/Gait assistance: Independent              Information systems manager Rankin (Stroke Patients Only)       Balance                                 Standardized Balance Assessment Standardized Balance Assessment : Dynamic Gait Index   Dynamic Gait Index Level Surface: Normal Change in Gait Speed: Normal Gait with Horizontal Head Turns: Normal Gait with Vertical Head Turns: Normal Gait and Pivot Turn: Normal Step Over Obstacle: Normal Step Around Obstacles: Normal Steps: Normal Total Score: 24       Pertinent Vitals/Pain Pain Assessment: No/denies pain    Home Living Family/patient expects to be discharged to:: Private residence Living Arrangements: Spouse/significant other Available Help at Discharge: Family;Available 24 hours/day Type of Home: House Home Access: Level entry              Prior Function Level of Independence: Independent               Hand Dominance   Dominant Hand: Right    Extremity/Trunk Assessment               Lower Extremity Assessment: Overall WFL for tasks assessed         Communication      Cognition     Overall Cognitive Status: Within Functional Limits for tasks assessed                      General Comments      Exercises        Assessment/Plan    PT Assessment    PT Diagnosis Difficulty walking   PT Problem List    PT Treatment Interventions     PT Goals (Current goals can be found in the Care Plan section) Acute Rehab PT Goals PT Goal Formulation: All assessment and education complete, DC therapy    Frequency     Barriers to discharge        Co-evaluation               End of Session Equipment Utilized During Treatment: Gait belt Activity Tolerance: Patient tolerated  treatment well Patient left: in chair;with call bell/phone within reach Nurse Communication: Mobility status         Time: [id number] PT Time Calculation (min) (ACUTE ONLY): 12 min   Charges:   PT Evaluation $Initial PT Evaluation Tier I: 1 Procedure     PT G Codes:        Hatice Bubel 02/27/2015, 12:51 PM  Jake SharkWendy Deondray Ospina, PT DPT [phone]

## 2015-02-27 NOTE — Progress Notes (Signed)
PT Evaluation G-Codes    02/27/15 1300  PT G-Codes **NOT FOR INPATIENT CLASS**  Functional Limitation Mobility: Walking and moving around  Mobility: Walking and Moving Around Current Status [phone]([id number]) [facility]  Mobility: Walking and Moving Around Discharge Status [phone]([id number]) [facility]   TheodoreWendy Mylissa Lambe, [place] DPT [phone]

## 2015-02-27 NOTE — Progress Notes (Addendum)
STROKE TEAM PROGRESS NOTE   HISTORY Katherine Wolfe is an 49 y.o. female who woke up at 0600 and when going to the bathroom noted she had blurred vision, she also noted she did not feel normal. Patient got ready for work and and just before leaving noted she was reading a paragraph over and over but for some reason it did not make sense. She drove to get a coffee and noted she could not remember how to order her coffee. She called her husband and then her boss. Her boss called EMS. She notes she then started to get a HA. On arrival she was able to converse but was having difficulty finding words--this was very intermittent and not consistant. She was also very labile and anxious. CT head was negative. NIHSS 0. Denies vertigo, double vision, focal weakness or numbness, slurred speech, imbalance.  She notes she is stressed. She also noted she has had a episode similar to this a few years ago at work but it was very brief.   Date last known well: Date: 02/26/2015 Time last known well: Time: 06:00 tPA Given: No: NIHSS 0   SUBJECTIVE (INTERVAL HISTORY) The patient's husband is present. She feels back to baseline and is anxious for discharge. She stated that she was reading a book and then the words became distorted "wavy lines", for , and then resolved. She went to [facility] ordering coffee and had difficulty with finding words. At the same time, she started to have HA. Currently, all symptoms resolved. She admits that she has HA associated with periods, but she is undergoing menopausal now so she has not having HA for several months. This is the first time aura happened to her.   OBJECTIVE Temp:  [97.6 F (36.4 C)-98.8 F (37.1 C)] 98.2 F (36.8 C) (05/21 0602) Pulse Rate:  [53-98] 69 (05/21 0602) Cardiac Rhythm:  [-] Sinus bradycardia (05/20 1930) Resp:  [9-16] 16 (05/21 0602) BP: (79-130)/(33-76) 99/46 mmHg (05/21 0659) SpO2:  [98 %-100 %] 100 % (05/21 0602) Weight:  [54.885 kg  (121 lb)] 54.885 kg (121 lb) (05/20 0912)   Recent Labs Lab 02/26/15 0920 02/26/15 1703 02/26/15 1722 02/26/15 1811  GLUCAP 62* 48* 57* 77    Recent Labs Lab 02/26/15 0900 02/26/15 0906 02/26/15 1944  NA 135 137 139  K 3.9 4.0 4.0  CL 102 101 107  CO2 26  --  25  GLUCOSE 97 96 83  BUN CREATININE 0.89 0.80 0.90  CALCIUM 9.8  --  8.8*    Recent Labs Lab 02/26/15 0900  AST 24  ALT 16  ALKPHOS 34*  BILITOT 0.8  PROT 6.6  ALBUMIN 3.9    Recent Labs Lab 02/26/15 0900 02/26/15 0906  WBC 4.1  --   NEUTROABS 1.8  --   HGB 13.1 13.9  HCT 38.9 41.0  MCV 93.7  --   PLT 229  --    No results for input(s): CKTOTAL, CKMB, CKMBINDEX, TROPONINI in the last 168 hours.  Recent Labs  02/26/15 0900  LABPROT 14.4  INR 1.10   No results for input(s): COLORURINE, LABSPEC, PHURINE, GLUCOSEU, HGBUR, BILIRUBINUR, KETONESUR, PROTEINUR, UROBILINOGEN, NITRITE, LEUKOCYTESUR in the last 72 hours.  Invalid input(s): APPERANCEUR     Component Value Date/Time   CHOL 157 02/27/2015 0444   TRIG 61 02/27/2015 0444   HDL 70 02/27/2015 0444   CHOLHDL 2.2 02/27/2015 0444   VLDL 12 02/27/2015 0444   LDLCALC 75 02/27/2015 0444  No results found for: HGBA1C No results found for: LABOPIA, COCAINSCRNUR, LABBENZ, AMPHETMU, THCU, LABBARB   Recent Labs Lab 02/26/15 0900  ETH <5    Dg Chest 2 View 02/26/2015    Negative, no acute cardiopulmonary abnormality.     Ct Head Wo Contrast 02/26/2015    Normal exam.    Mr Maxine GlennMra Head/brain Wo Cm 02/26/2015    Normal MRI head  Normal MRA head    CUS - Bilateral: 1-39% ICA stenosis. Vertebral artery flow is antegrade.  2D echo - - Left ventricle: The cavity size was normal. Wall thickness was normal. Systolic function was normal. The estimated ejection fraction was in the range of 55% to 60%. Left ventricular diastolic function parameters were normal. - Mitral valve: There was mild regurgitation. - Right atrium: The  atrium was mildly dilated. - Atrial septum: No defect or patent foramen ovale was identified. - Tricuspid valve: There was mild-moderate regurgitation.   PHYSICAL EXAM  Temp:  [98 F (36.7 C)] 98 F (36.7 C) (05/21 0924) Pulse Rate:  [83] 83 (05/21 0924) Resp:  [16] 16 (05/21 0924) BP: (99-104)/(46-54) 104/54 mmHg (05/21 0924) SpO2:  [100 %] 100 % (05/21 0924)  General - Well nourished, well developed, in no apparent distress.  Ophthalmologic - Sharp disc margins OU.   Cardiovascular - Regular rate and rhythm with no murmur.  Mental Status -  Level of arousal and orientation to time, place, and person were intact. Language including expression, naming, repetition, comprehension was assessed and found intact. Attention span and concentration were normal. Recent and remote memory were intact. Fund of Knowledge was assessed and was intact.  Cranial Nerves II - XII - II - Visual field intact OU. III, IV, VI - Extraocular movements intact. V - Facial sensation intact bilaterally. VII - Facial movement intact bilaterally. VIII - Hearing & vestibular intact bilaterally. X - Palate elevates symmetrically. XI - Chin turning & shoulder shrug intact bilaterally. XII - Tongue protrusion intact.  Motor Strength - The patient's strength was normal in all extremities and pronator drift was absent.  Bulk was normal and fasciculations were absent.   Motor Tone - Muscle tone was assessed at the neck and appendages and was normal.  Reflexes - The patient's reflexes were 1+ in all extremities and she had no pathological reflexes.  Sensory - Light touch, temperature/pinprick, vibration and proprioception, and Romberg testing were assessed and were symmetrical.    Coordination - The patient had normal movements in the hands and feet with no ataxia or dysmetria.  Tremor was absent.  Gait and Station - The patient's transfers, posture, gait, station, and turns were observed as  normal.  ASSESSMENT/PLAN Katherine Wolfe is a 49 y.o. female with history of remote tobacco and ongoing alcohol use presenting with visual disturbances and word finding difficulties associated with a headache.. She did not receive IV t-PA due to minimal deficits.  Complicated migraine  Resultant  resolution of deficits  MRI  normal  MRA  normal  Carotid Doppler  bilateral 1-39 percent stenosis. Vertebral arteries appear patent with antegrade flow.  2D Echo  unremarkable  LDL 75  HgbA1c pending  Lovenox for VTE prophylaxis  Diet regular Room service appropriate?: Yes; Fluid consistency:: Thin  no antithrombotic prior to admission, now on aspirin 325 mg orally every day.  Patient counseled to be compliant with her antithrombotic medications  Ongoing aggressive stroke risk factor management  Therapy recommendations:  No further therapy recommended  Disposition:  Discharge  Other Stroke Risk Factors  Cigarette smoker, quit smoking 19 years ago.  ETOH use  Migraine with aura  Other Active Problems  Hypoglycemia  Initial abnormal TSH - repeat was normal and normal FT4 and T3  Other Pertinent History  Adopted  Hospital day #   Delton See PA-C [facility] Pager [phone] 02/27/2015, 2:35 PM  I, the attending vascular neurologist, have personally obtained a history, examined the patient, evaluated laboratory data, individually viewed imaging studies and agree with radiology interpretations. I also discussed with pt and Dr. Benjamine Mola regarding her care plan. Together with the NP/PA, we formulated the assessment and plan of care which reflects our mutual decision.  I have made any additions or clarifications directly to the above note and agree with the findings and plan as currently documented.   49 yo F without stroke risk factors was admitted for episode of visual changes and wording finding difficulties associated with HA. By description,  typical for migraine HA with aura. Stroke work up negative. Pt has mildly low glucose on presentation, wondering whether it could be the trigger for migraine. OK to discharge from neuro standpoint. Pt needs to come back if similar episodes recur. Pt expressed understanding  Marvel Plan, MD PhD Stroke Neurology 02/28/2015 6:30 AM        To contact Stroke Continuity provider, please refer to [email or web address]. After hours, contact General Neurology

## 2015-02-27 NOTE — Progress Notes (Signed)
Patient ready for discharge to home; discharge instructions given and reviewed; Rx given for glucose meter; instructed to follow for hypoglycemia; patient accompanied out with her husband.

## 2015-02-27 NOTE — Discharge Summary (Addendum)
Physician Discharge Summary  Katherine Wolfe:[id number] DOB: 1965-10-25 DOA: 02/26/2015  PCP: [name], MD  Admit date: 02/26/2015 Discharge date: 02/27/2015  Time spent: 35 minutes  Recommendations for Outpatient Follow-up:  1. Labs pending at d/c: insulin level, C-peptide level, proinsulin, sulfonylurea urine screen, T3 2. Echo pending at d/c 3. Katherine Wolfe also advised that alcohol consumption can lower blood sugars as well as tumeric   Discharge Diagnoses:  Active Problems:   Raynaud phenomenon   Shoulder pain   TIA (transient ischemic attack)   Confusion   Headache   Migraine   Discharge Condition: improved  Diet recommendation: regular  Filed Weights   02/26/15 0912  Weight: 54.885 kg (121 lb)    History of present illness:  The Katherine Wolfe is a 49 y.o. year-old female with history of asthma, bilateral knee pain, cervical dysplasia, anorexia who presents with sudden onset confusion and aphasia. The Katherine Wolfe was last at their baseline health this morning when she first awoke. After breakfast, she states that she had a spot in her vision that she describes as a spot that you see after looking at a bright light but she had not been looking at anything bright. She had some difficulty reading some passages in a book right before leaving to go to [place]. She was able to drive to [place], but when she arrived at [employer], she could not figure out how to order her drink. Eventually she was able to get coffee and she went to sit down with her friends, but she knew that there was something unusual about her thinking. She asked them for help and with their assistance she was able to call her husband. She went out to her car and waited in her car for husband to arrive and he brought her to the emergency department. Around that time, she developed a throbbing 3 out of 5 frontal headache. She denies focal weakness, numbness, tingling. She has not had other blurred vision or  double vision. She denies frequent headaches and denies history of migraine headache. She has no known family history of migraine headache. She has never had a seizure nor does she have a family history of seizures. She has not had any major medication changes except for stopping a nitroglycerin patch started by Dr. [name] for a damaged tendon and reducing her Mobitz dose to 7.5 mg a couple weeks ago. She does not take any sedating or over-the-counter medications. She states she has been more stressed out than usual for the last week.  In the emergency department, her labs were unremarkable, her vital signs are stable except for mild bradycardia to the mid 50s. Head CT was within normal limits. She was seen by neurology for a possible code stroke but her NIHSS was 0 and she was deemed not a candidate for tPA. Neurology has recommended admission for evaluation for possible TIA, but they are also concerned that this may be a complicated migraine.  Hospital Course:  Complicated migraine vs hypoglycemia -on tumeric- will d/c as can lower blood sugar Hypoglycemia work up pending but does not appear to be drawn when blood sugar low -prescription given for glucometer -seen by neuro -cortisol ok- symptoms resolved Katherine Wolfe instructed to come back to ER if further episodes Procedures:  echo  Consultations:  neuro  Discharge Exam: Filed Vitals:   02/27/15 0924  BP: 104/54  Pulse: 83  Temp: 98 F (36.7 C)  Resp: 16    General: A+Ox3, NAD Cardiovascular: rrr Respiratory: clear  Discharge Instructions   Discharge Instructions    Diet general    Complete by:  As directed      Discharge instructions    Complete by:  As directed   Side effect of tumeric can be hypoglycemia so be wary     Increase activity slowly    Complete by:  As directed           Current Discharge Medication List    START taking these medications   Details  blood glucose meter kit and supplies KIT Dispense  based on Katherine Wolfe and insurance preference. Use up to four times daily as directed. (FOR hypoglycemia). Qty: 1 each, Refills: 0      CONTINUE these medications which have NOT CHANGED   Details  Calcium Carbonate-Vitamin D (CALCIUM + D PO) Take 1 tablet by mouth daily.     co-enzyme Q-10 30 MG capsule Take 30 mg by mouth 3 (three) times daily.      EPIPEN 2-PAK 0.3 MG/0.3ML DEVI Inject 0.3 mg into the muscle as needed (allergic reaction).     fish oil-omega-3 fatty acids 1000 MG capsule Take 2 g by mouth daily.      loratadine (CLARITIN) 10 MG tablet Take 10 mg by mouth daily as needed.     Magnesium 250 MG TABS Take 250 mg by mouth daily.    meloxicam (MOBIC) 7.5 MG tablet take 1 to 2 tablets by mouth once daily Qty: 60 tablet, Refills: 11    metroNIDAZOLE (METROGEL) 0.75 % gel Apply 1 application topically 2 (two) times daily.    METRONIDAZOLE, TOPICAL, (METROLOTION) 0.75 % LOTN Apply 1 application topically daily as needed (itch).     Multiple Vitamin (MULTIVITAMIN) capsule Take 1 capsule by mouth daily.      PROAIR HFA 108 (90 BASE) MCG/ACT inhaler Inhale 2 puffs into the lungs every 4 (four) hours as needed for wheezing.  Refills: 0    VITAMIN D, ERGOCALCIFEROL, PO Take by mouth. 1800 IU DAILY     VOLTAREN 1 % GEL Apply 2 g topically daily as needed (pain).       STOP taking these medications     TURMERIC PO        Allergies  Allergen Reactions  . Other Swelling    TREE NUTS; WASPS, HORNETS AND YELLOW JACKETS   Follow-up Information    Follow up with MCNEILL,WENDY, MD In 1 week.   Specialty:  Family Medicine   Why:  for lab results and blood sugar check   Contact information:   [place] [address] [phone]        The results of significant diagnostics from this hospitalization (including imaging, microbiology, ancillary and laboratory) are listed below for reference.    Significant Diagnostic Studies: Dg Chest 2  View  02/26/2015   CLINICAL DATA:  49 year old female with severe headache in confusion for 1 day. Former smoker. Initial encounter.  EXAM: CHEST  2 VIEW  COMPARISON:  None.  FINDINGS: Lung volumes within normal limits. Normal cardiac size and mediastinal contours. Visualized tracheal air column is within normal limits. Minimal increased interstitial markings. EKG button artifact. Otherwise the lungs are clear. No osseous abnormality identified.  IMPRESSION: Negative, no acute cardiopulmonary abnormality.   Electronically Signed   By: Genevie Ann M.D.   On: 02/26/2015 15:13   Ct Head Wo Contrast  02/26/2015   CLINICAL DATA:  Disorientation, confusion, memory loss, code stroke  EXAM: CT HEAD WITHOUT CONTRAST  TECHNIQUE: Contiguous axial images were obtained from the base of the skull through the vertex without intravenous contrast.  COMPARISON:  None  FINDINGS: Normal ventricular morphology.  No midline shift or mass effect.  Normal appearance of brain parenchyma.  No intracranial hemorrhage, mass lesion or evidence acute infarction.  No extra-axial fluid collections.  Sinuses clear and bones unremarkable.  IMPRESSION: Normal exam.  Findings called to [facility] on 02/26/2015 at 0919 hours.   Electronically Signed   By: Lavonia Dana M.D.   On: 02/26/2015 09:20   Mr Brain Wo Contrast  02/26/2015   CLINICAL DATA:  TIA.  Episode of confusion and language disturbance  EXAM: MRI HEAD WITHOUT CONTRAST  MRA HEAD WITHOUT CONTRAST  TECHNIQUE: Multiplanar, multiecho pulse sequences of the brain and surrounding structures were obtained without intravenous contrast. Angiographic images of the head were obtained using MRA technique without contrast.  COMPARISON:  CT head 02/26/2015  FINDINGS: MRI HEAD FINDINGS  Ventricle size is normal.  Cerebral volume is normal.  Pituitary normal in size. Negative for Chiari malformation. Normal orbit. Minimal mucosal edema paranasal sinuses.  Negative for acute or chronic infarction  Negative  for demyelinating disease. Cerebral white matter normal. Brainstem and cerebellum normal  Negative for intracranial hemorrhage.  Negative for mass or edema.  No shift of the midline structures.  MRA HEAD FINDINGS  Both vertebral arteries patent to the basilar. PICA patent bilaterally. Basilar widely patent. Superior cerebellar and posterior cerebral arteries widely patent  Internal carotid artery widely patent bilaterally. Anterior and middle cerebral arteries are normal  Negative for cerebral aneurysm.  IMPRESSION: Normal MRI head  Normal MRA head   Electronically Signed   By: Franchot Gallo M.D.   On: 02/26/2015 14:59   Mr Jodene Nam Head/brain Wo Cm  02/26/2015   CLINICAL DATA:  TIA.  Episode of confusion and language disturbance  EXAM: MRI HEAD WITHOUT CONTRAST  MRA HEAD WITHOUT CONTRAST  TECHNIQUE: Multiplanar, multiecho pulse sequences of the brain and surrounding structures were obtained without intravenous contrast. Angiographic images of the head were obtained using MRA technique without contrast.  COMPARISON:  CT head 02/26/2015  FINDINGS: MRI HEAD FINDINGS  Ventricle size is normal.  Cerebral volume is normal.  Pituitary normal in size. Negative for Chiari malformation. Normal orbit. Minimal mucosal edema paranasal sinuses.  Negative for acute or chronic infarction  Negative for demyelinating disease. Cerebral white matter normal. Brainstem and cerebellum normal  Negative for intracranial hemorrhage.  Negative for mass or edema.  No shift of the midline structures.  MRA HEAD FINDINGS  Both vertebral arteries patent to the basilar. PICA patent bilaterally. Basilar widely patent. Superior cerebellar and posterior cerebral arteries widely patent  Internal carotid artery widely patent bilaterally. Anterior and middle cerebral arteries are normal  Negative for cerebral aneurysm.  IMPRESSION: Normal MRI head  Normal MRA head   Electronically Signed   By: Franchot Gallo M.D.   On: 02/26/2015 14:59     Microbiology: No results found for this or any previous visit (from the past 240 hour(s)).   Labs: Basic Metabolic Panel:  Recent Labs Lab 02/26/15 0900 02/26/15 0906 02/26/15 1944  NA 135 137 139  K 3.9 4.0 4.0  CL 102 101 107  CO2 26  --  25  GLUCOSE 97 96 83  BUN _0 CREATININE 0.89 0.80 0.90  CALCIUM 9.8  --  8.8*   Liver Function Tests:  Recent Labs Lab 02/26/15 0900  AST 24  ALT  16  ALKPHOS 34*  BILITOT 0.8  PROT 6.6  ALBUMIN 3.9   No results for input(s): LIPASE, AMYLASE in the last 168 hours. No results for input(s): AMMONIA in the last 168 hours. CBC:  Recent Labs Lab 02/26/15 0900 02/26/15 0906  WBC 4.1  --   NEUTROABS 1.8  --   HGB 13.1 13.9  HCT 38.9 41.0  MCV 93.7  --   PLT 229  --    Cardiac Enzymes: No results for input(s): CKTOTAL, CKMB, CKMBINDEX, TROPONINI in the last 168 hours. BNP: BNP (last 3 results) No results for input(s): BNP in the last 8760 hours.  ProBNP (last 3 results) No results for input(s): PROBNP in the last 8760 hours.  CBG:  Recent Labs Lab 02/26/15 0920 02/26/15 1703 02/26/15 1722 02/26/15 1811 02/27/15 1109  GLUCAP 62* 48* 57* 77 105*       Signed:  Nanda Bittick  [facility] 02/27/2015, 1:01 PM

## 2015-02-27 NOTE — Progress Notes (Signed)
  Echocardiogram 2D Echocardiogram has been performed.  Delcie RochENNINGTON, Maly Lemarr 02/27/2015, 9:05 AM

## 2015-02-27 NOTE — Evaluation (Signed)
Speech Language Pathology Evaluation Patient Details Name: Katherine Wolfe MRN: [id number] DOB: 1966/04/11 Today's Date: 02/27/2015 Time: 4098-1191 SLP Time Calculation (min) (ACUTE ONLY): 22 min  Problem List:  Patient Active Problem List   Diagnosis Date Noted  . TIA (transient ischemic attack) 02/26/2015  . Confusion 02/26/2015  . Headache 02/26/2015  . Right shoulder pain 11/27/2014  . Shoulder pain 06/19/2012  . Raynaud phenomenon 11/02/2011  . Gait abnormality 09/07/2011  . Benign hypermobility syndrome 08/16/2011  . Knee pain, bilateral 09/22/2009  . PES PLANUS 09/22/2009  . UNEQUAL LEG LENGTH 09/22/2009   Past Medical History:  Past Medical History  Diagnosis Date  . Anorexia     in high school, none since  . Asthma     well controlled  . Rosacea   . Knee pain, bilateral 11-12    SEES DR. FIELDS  . Cervical dysplasia     and shoulder pain   Past Surgical History:  Past Surgical History  Procedure Laterality Date  . Rhinoplasty    . Hysteroscopy  01/2008    [facility] & [facility]  . Pelvic laparoscopy  09/1999    right ovarian fibroma  . Gynecologic cryosurgery    . Knee surgery      Arthroscopic   HPI:  Pt is a 49 yo female with a pmhx significant for asthma, bilateral knee pain, cervical dysplasia, anorexia who presented to ED on 02/26/15 with sudden onset confusion and aphasia; MR head on 5/20 was normal and CXR was WNL as well on 02/26/15.     Assessment / Plan / Recommendation Clinical Impression  Pt's symptoms seem to have resolved since initial episode of expressive/receptive aphasia as pt was able to carry on a complex conversation, follow complex directions and complete various cognitive tasks related to memory without any difficulty noted; ST does not need to f/u at D/C unless symptoms return.      SLP Assessment  Patient does not need any further Speech Language Pathology Services    Follow Up Recommendations  None    Frequency and Duration   n/a      Pertinent Vitals/Pain Pain Assessment: No/denies pain   SLP Goals     SLP Evaluation Prior Functioning  Cognitive/Linguistic Baseline: Within functional limits Type of Home: House  Lives With: Spouse Available Help at Discharge: Family;Available 24 hours/day Education: Master's degree in social work Vocation: Full time employment   Cognition  Overall Cognitive Status: Within Functional Limits for tasks assessed Arousal/Alertness: Awake/alert Orientation Level: Oriented X4 Memory: Appears intact Awareness: Appears intact Problem Solving: Appears intact Safety/Judgment: Appears intact    Comprehension  Auditory Comprehension Overall Auditory Comprehension: Appears within functional limits for tasks assessed Yes/No Questions: Within Functional Limits Commands: Within Functional Limits Conversation: Complex Visual Recognition/Discrimination Discrimination: Within Function Limits Reading Comprehension Reading Status: Within funtional limits    Expression Expression Primary Mode of Expression: Verbal Verbal Expression Overall Verbal Expression: Appears within functional limits for tasks assessed Initiation: No impairment Level of Generative/Spontaneous Verbalization: Conversation Repetition: No impairment Naming: No impairment Pragmatics: No impairment Non-Verbal Means of Communication: Not applicable Written Expression Dominant Hand: Right Written Expression: Not tested   Oral / Motor Oral Motor/Sensory Function Overall Oral Motor/Sensory Function: Appears within functional limits for tasks assessed Motor Speech Overall Motor Speech: Appears within functional limits for tasks assessed Respiration: Within functional limits Phonation: Normal Resonance: Within functional limits Articulation: Within functional limitis Intelligibility: Intelligible Motor Planning: Witnin functional limits Motor Speech Errors: Not applicable  Functional Assessment Tool Used:  NOMS Functional Limitations: Spoken language expressive Spoken Language Expression Current Status [phone](G9162): 0 percent impaired, limited or restricted Spoken Language Expression Goal Status (U0454(G9163): 0 percent impaired, limited or restricted Spoken Language Expression Discharge Status [phone](G9164): 0 percent impaired, limited or restricted   Zhana Jeangilles,PAT, M.S., CCC-SLP 02/27/2015, 10:33 AM

## 2015-03-01 LAB — HEMOGLOBIN A1C
Hgb A1c MFr Bld: 5.2 % (ref 4.8–5.6)
Mean Plasma Glucose: 103 mg/dL

## 2015-03-01 LAB — C-PEPTIDE: C-Peptide: 4 ng/mL (ref 1.1–4.4)

## 2015-03-02 LAB — PROINSULIN/INSULIN RATIO
INSULIN: 6.8 u[IU]/mL
Proinsulin/Insulin Ratio: 24 %
Proinsulin: 11 pmol/L

## 2015-03-02 LAB — T3: T3, Total: 83 ng/dL (ref 71–180)

## 2015-03-12 LAB — SULFONYLUREA HYPOGLYCEMICS PANEL, URINE
ACETOHEXAMIDE - URINE: NEGATIVE ug/mL
CHLORPROPAMIDE - URINE: NEGATIVE ug/mL
Glimepiride, ur: NEGATIVE ng/mL
Glipizide, ur: NEGATIVE ng/mL
Glyburide, ur: NEGATIVE ng/mL
Nateglinide, ur: NEGATIVE ng/mL
Repaglinide, ur: NEGATIVE ng/mL
Tolazamide: NEGATIVE ug/mL
Tolbutamide, ur: NEGATIVE ug/mL

## 2015-03-17 ENCOUNTER — Encounter: Payer: Self-pay | Admitting: Sports Medicine

## 2015-03-17 ENCOUNTER — Ambulatory Visit (INDEPENDENT_AMBULATORY_CARE_PROVIDER_SITE_OTHER): Payer: No Typology Code available for payment source | Admitting: Sports Medicine

## 2015-03-17 VITALS — BP 103/42 | HR 58 | Ht 64.0 in | Wt 120.0 lb

## 2015-03-17 DIAGNOSIS — M25511 Pain in right shoulder: Secondary | ICD-10-CM

## 2015-03-17 NOTE — Progress Notes (Signed)
   Subjective:    Patient ID: Katherine DunkerMichelle A Wolfe, female    DOB: 1966/09/01, 49 y.o.   MRN: [id number]  HPI Ms. Katherine Wolfe is a 49 year old right-hand-dominant female with a past history significant for Ehler's Danlos syndrome who presents for followup of right shoulder pain. she was last found to have a subluxing right biceps tendon.  She has been doing arm curl exercises as well as mild external rotation within a non-painful arc exercises.  Her symptoms are somewhat improved today.  She denies any persistent subluxing or popping in the right shoulder. Recall that she did not tolerate the nitroglycerin patch due to significant side effects.  She would like to return to yoga at some point.  Past medical history, social history, medications, and allergies were reviewed and are up to date in the chart. Review of Systems 7 point review of systems was performed and was otherwise negative unless noted in the history of present illness.     Objective:   Physical Exam BP 103/42 mmHg  Pulse 58  Ht 5\' 4"  (1.626 m)  Wt 120 lb (54.432 kg)  BMI 20.59 kg/m2  LMP 12/27/2014 GEN: The patient is well-developed well-nourished female and in no acute distress.  She is awake alert and oriented x3. SKIN: warm and well-perfused, no rash  Neuro: Strength 5/5 globally. Sensation intact throughout. DTRs 2/4 bilaterally. No focal deficits. Vasc: +2 bilateral distal pulses. No edema.  MSK: Full cervical ROM.Negative speeds test.  Full shoulder range of motion.  Mildly positive Jobst test.  Positive Hawkins.  Positive joint hypermobility.  No atrophy.  Limited musculoskeletal ultrasound: long and short axis views were taken of the right shoulder.  Dynamic testing reveals biceps tendon does sublux out of the groove with extreme shoulder external rotation and abduction.  There is less hypoechoic change around biceps tendon.  The previously seen supraspinatus tear has improved.    Assessment & Plan:  Please see problem  based assessment and plan in the problem list.

## 2015-03-17 NOTE — Patient Instructions (Signed)
-  Avoid excessive external rotation exercises -Start trying home yoga exercises against a wall -As it improves, you may start to try a light yoga class in a few weeks -Try swimming again, alternating 6 overhead strokes with 6 non-overhead. Don't do this too fast or push it too hard. -If you are having a more painful day, then be mindful to not press your progression that day -Try again the next day in that case -We will check this back in about 6 weeks or sooner if needed.

## 2015-03-17 NOTE — Assessment & Plan Note (Signed)
-  Advance rehabilitation program to include home exercises against a wall, yoga poses against a wall before full weight -Avoid excessive external rotation exercises -Reminder: Does not tolerate NTG -Plan follow-up in 6 weeks or sooner if needed.

## 2015-03-26 ENCOUNTER — Encounter: Payer: Self-pay | Admitting: Gynecology

## 2015-05-04 ENCOUNTER — Ambulatory Visit (INDEPENDENT_AMBULATORY_CARE_PROVIDER_SITE_OTHER): Payer: No Typology Code available for payment source | Admitting: Gynecology

## 2015-05-04 ENCOUNTER — Encounter: Payer: Self-pay | Admitting: Gynecology

## 2015-05-04 VITALS — BP 120/60 | Ht 64.0 in | Wt 124.0 lb

## 2015-05-04 DIAGNOSIS — Z01419 Encounter for gynecological examination (general) (routine) without abnormal findings: Secondary | ICD-10-CM

## 2015-05-04 DIAGNOSIS — N926 Irregular menstruation, unspecified: Secondary | ICD-10-CM

## 2015-05-04 NOTE — Patient Instructions (Signed)
You may obtain a copy of any labs that were done today by logging onto MyChart as outlined in the instructions provided with your AVS (after visit summary). The office will not call with normal lab results but certainly if there are any significant abnormalities then we will contact you.   Health Maintenance, Female A healthy lifestyle and preventative care can promote health and wellness.  Maintain regular health, dental, and eye exams.  Eat a healthy diet. Foods like vegetables, fruits, whole grains, low-fat dairy products, and lean protein foods contain the nutrients you need without too many calories. Decrease your intake of foods high in solid fats, added sugars, and salt. Get information about a proper diet from your caregiver, if necessary.  Regular physical exercise is one of the most important things you can do for your health. Most adults should get at least 150 minutes of moderate-intensity exercise (any activity that increases your heart rate and causes you to sweat) each week. In addition, most adults need muscle-strengthening exercises on 2 or more days a week.   Maintain a healthy weight. The body mass index (BMI) is a screening tool to identify possible weight problems. It provides an estimate of body fat based on height and weight. Your caregiver can help determine your BMI, and can help you achieve or maintain a healthy weight. For adults 20 years and older:  A BMI below 18.5 is considered underweight.  A BMI of 18.5 to 24.9 is normal.  A BMI of 25 to 29.9 is considered overweight.  A BMI of 30 and above is considered obese.  Maintain normal blood lipids and cholesterol by exercising and minimizing your intake of saturated fat. Eat a balanced diet with plenty of fruits and vegetables. Blood tests for lipids and cholesterol should begin at age 61 and be repeated every 5 years. If your lipid or cholesterol levels are high, you are over 50, or you are a high risk for heart  disease, you may need your cholesterol levels checked more frequently.Ongoing high lipid and cholesterol levels should be treated with medicines if diet and exercise are not effective.  If you smoke, find out from your caregiver how to quit. If you do not use tobacco, do not start.  Lung cancer screening is recommended for adults aged 33 80 years who are at high risk for developing lung cancer because of a history of smoking. Yearly low-dose computed tomography (CT) is recommended for people who have at least a 30-pack-year history of smoking and are a current smoker or have quit within the past 15 years. A pack year of smoking is smoking an average of 1 pack of cigarettes a day for 1 year (for example: 1 pack a day for 30 years or 2 packs a day for 15 years). Yearly screening should continue until the smoker has stopped smoking for at least 15 years. Yearly screening should also be stopped for people who develop a health problem that would prevent them from having lung cancer treatment.  If you are pregnant, do not drink alcohol. If you are breastfeeding, be very cautious about drinking alcohol. If you are not pregnant and choose to drink alcohol, do not exceed 1 drink per day. One drink is considered to be 12 ounces (355 mL) of beer, 5 ounces (148 mL) of wine, or 1.5 ounces (44 mL) of liquor.  Avoid use of street drugs. Do not share needles with anyone. Ask for help if you need support or instructions about stopping  the use of drugs.  High blood pressure causes heart disease and increases the risk of stroke. Blood pressure should be checked at least every 1 to 2 years. Ongoing high blood pressure should be treated with medicines, if weight loss and exercise are not effective.  If you are 59 to 50 years old, ask your caregiver if you should take aspirin to prevent strokes.  Diabetes screening involves taking a blood sample to check your fasting blood sugar level. This should be done once every 3  years, after age 91, if you are within normal weight and without risk factors for diabetes. Testing should be considered at a younger age or be carried out more frequently if you are overweight and have at least 1 risk factor for diabetes.  Breast cancer screening is essential preventative care for women. You should practice "breast self-awareness." This means understanding the normal appearance and feel of your breasts and may include breast self-examination. Any changes detected, no matter how small, should be reported to a caregiver. Women in their 66s and 30s should have a clinical breast exam (CBE) by a caregiver as part of a regular health exam every 1 to 3 years. After age 101, women should have a CBE every year. Starting at age 100, women should consider having a mammogram (breast X-ray) every year. Women who have a family history of breast cancer should talk to their caregiver about genetic screening. Women at a high risk of breast cancer should talk to their caregiver about having an MRI and a mammogram every year.  Breast cancer gene (BRCA)-related cancer risk assessment is recommended for women who have family members with BRCA-related cancers. BRCA-related cancers include breast, ovarian, tubal, and peritoneal cancers. Having family members with these cancers may be associated with an increased risk for harmful changes (mutations) in the breast cancer genes BRCA1 and BRCA2. Results of the assessment will determine the need for genetic counseling and BRCA1 and BRCA2 testing.  The Pap test is a screening test for cervical cancer. Women should have a Pap test starting at age 57. Between ages 25 and 35, Pap tests should be repeated every 2 years. Beginning at age 37, you should have a Pap test every 3 years as long as the past 3 Pap tests have been normal. If you had a hysterectomy for a problem that was not cancer or a condition that could lead to cancer, then you no longer need Pap tests. If you are  between ages 50 and 76, and you have had normal Pap tests going back 10 years, you no longer need Pap tests. If you have had past treatment for cervical cancer or a condition that could lead to cancer, you need Pap tests and screening for cancer for at least 20 years after your treatment. If Pap tests have been discontinued, risk factors (such as a new sexual partner) need to be reassessed to determine if screening should be resumed. Some women have medical problems that increase the chance of getting cervical cancer. In these cases, your caregiver may recommend more frequent screening and Pap tests.  The human papillomavirus (HPV) test is an additional test that may be used for cervical cancer screening. The HPV test looks for the virus that can cause the cell changes on the cervix. The cells collected during the Pap test can be tested for HPV. The HPV test could be used to screen women aged 44 years and older, and should be used in women of any age  who have unclear Pap test results. After the age of 55, women should have HPV testing at the same frequency as a Pap test.  Colorectal cancer can be detected and often prevented. Most routine colorectal cancer screening begins at the age of 44 and continues through age 20. However, your caregiver may recommend screening at an earlier age if you have risk factors for colon cancer. On a yearly basis, your caregiver may provide home test kits to check for hidden blood in the stool. Use of a small camera at the end of a tube, to directly examine the colon (sigmoidoscopy or colonoscopy), can detect the earliest forms of colorectal cancer. Talk to your caregiver about this at age 86, when routine screening begins. Direct examination of the colon should be repeated every 5 to 10 years through age 13, unless early forms of pre-cancerous polyps or small growths are found.  Hepatitis C blood testing is recommended for all people born from [birth year] through 1966 and any  individual with known risks for hepatitis C.  Practice safe sex. Use condoms and avoid high-risk sexual practices to reduce the spread of sexually transmitted infections (STIs). Sexually active women aged 36 and younger should be checked for Chlamydia, which is a common sexually transmitted infection. Older women with new or multiple partners should also be tested for Chlamydia. Testing for other STIs is recommended if you are sexually active and at increased risk.  Osteoporosis is a disease in which the bones lose minerals and strength with aging. This can result in serious bone fractures. The risk of osteoporosis can be identified using a bone density scan. Women ages 20 and over and women at risk for fractures or osteoporosis should discuss screening with their caregivers. Ask your caregiver whether you should be taking a calcium supplement or vitamin D to reduce the rate of osteoporosis.  Menopause can be associated with physical symptoms and risks. Hormone replacement therapy is available to decrease symptoms and risks. You should talk to your caregiver about whether hormone replacement therapy is right for you.  Use sunscreen. Apply sunscreen liberally and repeatedly throughout the day. You should seek shade when your shadow is shorter than you. Protect yourself by wearing long sleeves, pants, a wide-brimmed hat, and sunglasses year round, whenever you are outdoors.  Notify your caregiver of new moles or changes in moles, especially if there is a change in shape or color. Also notify your caregiver if a mole is larger than the size of a pencil eraser.  Stay current with your immunizations. Document Released: 04/10/2011 Document Revised: 01/20/2013 Document Reviewed: 04/10/2011 [facility] Patient Information 2014 [employer].

## 2015-05-04 NOTE — Progress Notes (Signed)
MEGHNA HAGMANN 02/02/1966 [id number]        49 y.o.  G0P0 for annual exam.  Doing well overall. Does note of the past year her menses have become more sporadic with some skips. Also having some mild hot flashes and sweats.  Past medical history,surgical history, problem list, medications, allergies, family history and social history were all reviewed and documented as reviewed in the EPIC chart.  ROS:  Performed with pertinent positives and negatives included in the history, assessment and plan.   Additional significant findings :  none   Exam: [facility] Vitals:   05/04/15 1458  BP: 120/60  Height:  (1.626 m)  Weight: 124 lb (56.246 kg)   General appearance:  Normal affect, orientation and appearance. Skin: Grossly normal HEENT: Without gross lesions.  No cervical or supraclavicular adenopathy. Thyroid normal.  Lungs:  Clear without wheezing, rales or rhonchi Cardiac: RR, without RMG Abdominal:  Soft, nontender, without masses, guarding, rebound, organomegaly or hernia Breasts:  Examined lying and sitting without masses, retractions, discharge or axillary adenopathy. Pelvic:  Ext/BUS/vagina normal  Cervix normal  Uterus anteverted, normal size, shape and contour, midline and mobile nontender   Adnexa  Without masses or tenderness    Anus and perineum  Normal   Rectovaginal  Normal sphincter tone without palpated masses or tenderness.    Assessment/Plan:  49 y.o. G0P0 female for annual exam with mild irregular menses, condom contraception.   1. Menopausal symptoms. Patients having some skips in her menses where she will skip up to 1-2 months. Has regular menses when they occur. No prolonged or atypical bleeding. Also having some mild hot flushes and sweats. Options to check hormone level now discussed but declined. Will keep menstrual calendar over the next year. Of prolonged or atypical bleeding she knows to call. Otherwise if acceptable symptoms and will follow  at this point. What to expect in the perimenopausal time frame was reviewed with her. 2. Condom contraception. We discussed this multiple times and she is comfortable with condoms accepting the failure risk. 3. Mammography 03/2015. Continue with annual mammography. SBE monthly reviewed. 4. Pap smear/HPV negative 2015.  No Pap smear done today.  History of cryosurgery a number of years ago with normal Pap smears afterwards. Plan repeat Pap smear at 3-5 year interval. 5. Health maintenance. No routine blood work done as she has this done at her primary physician's office. Also had a lot of lab work done through a recent hospitalization. Follow up in one year, sooner as needed.   [name], 3:20 PM 05/04/2015

## 2015-05-05 LAB — URINALYSIS W MICROSCOPIC + REFLEX CULTURE
BACTERIA UA: NONE SEEN [HPF]
BILIRUBIN URINE: NEGATIVE
Casts: NONE SEEN [LPF]
Crystals: NONE SEEN [HPF]
Glucose, UA: NEGATIVE
HGB URINE DIPSTICK: NEGATIVE
Ketones, ur: NEGATIVE
Leukocytes, UA: NEGATIVE
NITRITE: NEGATIVE
PROTEIN: NEGATIVE
RBC / HPF: NONE SEEN RBC/HPF (ref <=2)
SPECIFIC GRAVITY, URINE: 1.004 (ref 1.001–1.035)
Squamous Epithelial / LPF: NONE SEEN [HPF] (ref <=5)
WBC, UA: NONE SEEN WBC/HPF (ref <=5)
Yeast: NONE SEEN [HPF]
pH: 6.5 (ref 5.0–8.0)

## 2015-05-19 ENCOUNTER — Encounter: Payer: Self-pay | Admitting: Sports Medicine

## 2015-05-19 ENCOUNTER — Ambulatory Visit (INDEPENDENT_AMBULATORY_CARE_PROVIDER_SITE_OTHER): Payer: No Typology Code available for payment source | Admitting: Sports Medicine

## 2015-05-19 VITALS — BP 97/55

## 2015-05-19 DIAGNOSIS — M25511 Pain in right shoulder: Secondary | ICD-10-CM

## 2015-05-19 DIAGNOSIS — Q796 Ehlers-Danlos syndrome: Secondary | ICD-10-CM | POA: Diagnosis not present

## 2015-05-19 DIAGNOSIS — M357 Hypermobility syndrome: Secondary | ICD-10-CM

## 2015-05-19 NOTE — Patient Instructions (Addendum)
Dr Dorthula Nettles Monday August 15th at 915a [facility] [address] [place] [address] [phone]

## 2015-05-19 NOTE — Progress Notes (Signed)
Katherine Wolfe - 49 y.o. female MRN [id number]  Date of birth: [birth year]  CC: Right shoulder follow up  SUBJECTIVE:   HPI  Katherine Wolfe is a 49 year old right-hand-dominant female with a past history significant for Ehler's Danlos syndrome who presents for followup:   Recurrent Right Shoulder discomfort and instability:  - she was able to increase activity for 2 weeks since last appointemnt, then "did too much", such as downward facing dog, and has been in pain since.  - Now having lots of pain reaching directly outward and at a 45 degrees.  - Too much pain with breast stroke as well. Usually much worse after exercise.   - Pain frequently wakes her up at night.  - In addition to popping more with arm flexion, multiple positions feel unstable, despite no episodes of complete dislocation.  - She has trouble reaching around her back to put her belt her belt on or hook her bra.   Also reports locking of her right elbow occasionally at mid extension. Not necessarily painful.  Does not think it is tied to shoulder issues.    ROS:     7 point review of systems was performed and was otherwise negative unless noted in the history of present illness.  HISTORY: Past Medical, Surgical, Social, and Family History Reviewed & Updated per EMR.    OBJECTIVE: BP 97/55 mmHg  LMP 03/31/2015  Physical Exam  GEN: The patient is well-developed well-nourished female and in no acute distress. She is awake alert and oriented x3. SKIN: warm and well-perfused, no rash  Neuro: Strength 5/5 globally. Sensation intact throughout. DTRs 2/4 bilaterally. No focal deficits. Vasc: +2 bilateral distal pulses. No edema.  MSK: Full cervical ROM. Full shoulder range of motion.+ speeds test.   positive Jobst test. +Hawkins. Positive joint hypermobility. No atrophy. +sulcus, +O'brien's    MEDICATIONS, LABS & OTHER ORDERS: Previous Medications   CALCIUM CARBONATE-VITAMIN D (CALCIUM + D PO)    Take 1 tablet by mouth  daily.    CO-ENZYME Q-10 30 MG CAPSULE    Take 30 mg by mouth 3 (three) times daily.     EPIPEN 2-PAK 0.3 MG/0.3ML DEVI    Inject 0.3 mg into the muscle as needed (allergic reaction).    FISH OIL-OMEGA-3 FATTY ACIDS 1000 MG CAPSULE    Take 2 g by mouth daily.     LORATADINE (CLARITIN) 10 MG TABLET    Take 10 mg by mouth daily as needed.    MAGNESIUM 250 MG TABS    Take 250 mg by mouth daily.   MELOXICAM (MOBIC) 7.5 MG TABLET    take 1 to 2 tablets by mouth once daily   METRONIDAZOLE (METROGEL) 0.75 % GEL    Apply 1 application topically 2 (two) times daily.   METRONIDAZOLE, TOPICAL, (METROLOTION) 0.75 % LOTN    Apply 1 application topically daily as needed (itch).    MULTIPLE VITAMIN (MULTIVITAMIN) CAPSULE    Take 1 capsule by mouth daily.     PROAIR HFA 108 (90 BASE) MCG/ACT INHALER    Inhale 2 puffs into the lungs every 4 (four) hours as needed for wheezing.    VITAMIN D, ERGOCALCIFEROL, PO    Take by mouth. 1800 IU DAILY    VOLTAREN 1 % GEL    Apply 2 g topically daily as needed (pain).    Modified Medications   No medications on file   New Prescriptions   No medications on file   Discontinued Medications  No medications on file  No orders of the defined types were placed in this encounter.   ASSESSMENT & PLAN: See problem based charting & AVS for pt instructions.

## 2015-05-25 NOTE — Assessment & Plan Note (Signed)
Awaiting gentics testing  Based on testing results likely to be ED Type III

## 2015-06-13 ENCOUNTER — Encounter: Payer: Self-pay | Admitting: Sports Medicine

## 2015-10-12 ENCOUNTER — Ambulatory Visit (INDEPENDENT_AMBULATORY_CARE_PROVIDER_SITE_OTHER): Payer: Managed Care, Other (non HMO) | Admitting: Family Medicine

## 2015-10-12 ENCOUNTER — Encounter: Payer: Self-pay | Admitting: Family Medicine

## 2015-10-12 VITALS — BP 90/50 | Ht 64.0 in | Wt 118.2 lb

## 2015-10-12 DIAGNOSIS — M7741 Metatarsalgia, right foot: Secondary | ICD-10-CM

## 2015-10-12 DIAGNOSIS — M79671 Pain in right foot: Secondary | ICD-10-CM | POA: Diagnosis not present

## 2015-10-12 DIAGNOSIS — S76012A Strain of muscle, fascia and tendon of left hip, initial encounter: Secondary | ICD-10-CM

## 2015-10-12 DIAGNOSIS — M79672 Pain in left foot: Secondary | ICD-10-CM | POA: Diagnosis not present

## 2015-10-12 NOTE — Progress Notes (Signed)
Katherine DunkerMichelle A Wolfe - 50 y.o. female MRN [id number]  Date of birth: [birth year]  CC: Left Hip pain  SUBJECTIVE:   HPI  50 yo with benign hypermobility syndrome presents with b/l foot pain and left hip pain localized in the groin x 4 months.  - Anteriolateral hip pain only on the left - Lots of stretching/yoga.  - Minimal strength training.  - No radiating pain.  - NO lower extremity symptoms.    Right foot pain - Hx of stress fracture of the right foot in 2015.  - No swelling - NO bruising. - No specific injury - Tender over forefoot on the right.  - No longer wearing MT pads.   - Pain with weightbearing.   ROS:     7 point review of systems was performed and was otherwise negative unless noted in the history of present illness.  HISTORY: Past Medical, Surgical, Social, and Family History Reviewed & Updated per EMR.   OBJECTIVE: BP 90/50 mmHg  Ht 5\' 4"  (1.626 m)  Wt 118 lb 3.2 oz (53.615 kg)  BMI 20.28 kg/m2  Physical Exam  Calm, NAD Non-labored breathing  Hip: left ROM IR: 80 Deg w/o pain, ER: 80 Deg, Flexion: 120 Deg, Extension: 100 Deg, Abduction: 45 Deg, Adduction: 45 Deg Strength IR: 5/5, ER: 5/5, Flexion: 5/5, Extension: 5/5, Abduction: 5/5, Adduction: 5/5 Pelvic alignment unremarkable to inspection and palpation. Standing hip rotation and gait without trendelenburg / unsteadiness. Greater trochanter without tenderness to palpation. No tenderness over piriformis and greater trochanter. No SI joint tenderness and normal minimal SI movement. Negative Patrick's/compression/distraction.  Tenderness inferior to   Right foot:  - On inspection, there is no dorsal swelling.  Pes planus and loss of transverse arch. - Gait is antalgic - There is tenderness to palpation over the plantar aspect of second metatarsal head. No dorsal tenderness.  - Loss of transverse arch  - Ankle range of motion is full without pain. - Strength is 5 out of 5 in ankle and foot.  -  Neurovascular status normal.    MEDICATIONS, LABS & OTHER ORDERS: Previous Medications   CALCIUM CARBONATE-VITAMIN D (CALCIUM + D PO)    Take 1 tablet by mouth daily.    CO-ENZYME Q-10 30 MG CAPSULE    Take 30 mg by mouth 3 (three) times daily.     CYCLOBENZAPRINE (FLEXERIL) 10 MG TABLET    Take 10 mg by mouth 3 (three) times daily.   EPIPEN 2-PAK 0.3 MG/0.3ML DEVI    Inject 0.3 mg into the muscle as needed (allergic reaction).    FISH OIL-OMEGA-3 FATTY ACIDS 1000 MG CAPSULE    Take 2 g by mouth daily.     LORATADINE (CLARITIN) 10 MG TABLET    Take 10 mg by mouth daily as needed.    MAGNESIUM 250 MG TABS    Take 250 mg by mouth daily.   MELOXICAM (MOBIC) 7.5 MG TABLET    take 1 to 2 tablets by mouth once daily   METHOCARBAMOL (ROBAXIN) 500 MG TABLET       METRONIDAZOLE (METROGEL) 0.75 % GEL    Apply 1 application topically 2 (two) times daily.   METRONIDAZOLE, TOPICAL, (METROLOTION) 0.75 % LOTN    Apply 1 application topically daily as needed (itch).    MULTIPLE VITAMIN (MULTIVITAMIN) CAPSULE    Take 1 capsule by mouth daily.     ONDANSETRON (ZOFRAN) 4 MG TABLET    take 1 tablet by mouth every 6 hours if  needed   OXYCODONE-ACETAMINOPHEN (PERCOCET) 10-325 MG TABLET    Take by mouth every 6 (six) hours as needed. for pain   PROAIR HFA 108 (90 BASE) MCG/ACT INHALER    Inhale 2 puffs into the lungs every 4 (four) hours as needed for wheezing.    QVAR 40 MCG/ACT INHALER       SULFAMETHOXAZOLE-TRIMETHOPRIM (BACTRIM DS,SEPTRA DS) 800-160 MG TABLET    TAKE 1 TABLET BY MOUTH 2 TIMES DAILY FOR 3 DAYS   VITAMIN D, ERGOCALCIFEROL, PO    Take by mouth. 1800 IU DAILY    VOLTAREN 1 % GEL    Apply 2 g topically daily as needed (pain).    Modified Medications   No medications on file   New Prescriptions   No medications on file   Discontinued Medications   No medications on file  No orders of the defined types were placed in this encounter.   ASSESSMENT & PLAN: Metatarsalgia: Provided with  metatarsal pads. F/u PRN for adjustments.    Hip pain, right side: Mild muscle strain.  Difficult to localize, but insertional to the iliac crest.  Exam relatively benign. With benign hypermobility syndrome discussed importance of strengthening vs.  Stretching.  Given pelvic stabilizer handout and demonstrated exercises.  - f/u 6-8 weeks.

## 2015-10-19 DIAGNOSIS — M7741 Metatarsalgia, right foot: Secondary | ICD-10-CM | POA: Insufficient documentation

## 2015-10-19 DIAGNOSIS — R1032 Left lower quadrant pain: Secondary | ICD-10-CM | POA: Insufficient documentation

## 2015-11-17 ENCOUNTER — Ambulatory Visit (INDEPENDENT_AMBULATORY_CARE_PROVIDER_SITE_OTHER): Payer: Managed Care, Other (non HMO) | Admitting: Sports Medicine

## 2015-11-17 ENCOUNTER — Encounter: Payer: Self-pay | Admitting: Sports Medicine

## 2015-11-17 VITALS — BP 93/63

## 2015-11-17 DIAGNOSIS — M7741 Metatarsalgia, right foot: Secondary | ICD-10-CM | POA: Diagnosis not present

## 2015-11-17 DIAGNOSIS — Q796 Ehlers-Danlos syndrome: Secondary | ICD-10-CM

## 2015-11-17 DIAGNOSIS — M357 Hypermobility syndrome: Secondary | ICD-10-CM

## 2015-11-17 DIAGNOSIS — R1032 Left lower quadrant pain: Secondary | ICD-10-CM

## 2015-11-17 DIAGNOSIS — R1030 Lower abdominal pain, unspecified: Secondary | ICD-10-CM

## 2015-11-17 NOTE — Assessment & Plan Note (Signed)
Now recovering from RT labral surgery  Doing better with almost full ROM  Cont exercises to stab joints

## 2015-11-17 NOTE — Assessment & Plan Note (Signed)
Adductor strain seems likely Some strain of sartorius as well  Geven HEP Use compression pants for exercise Stop groin stretch  Reck in 6 weeks If not better do [place]

## 2015-11-17 NOTE — Assessment & Plan Note (Signed)
This improved with resumption of MT pad  Today added hammer toe pad for MTP 2

## 2015-11-17 NOTE — Progress Notes (Signed)
Patient ID: Katherine Wolfe, female   DOB: [birth year], 50 y.o.   MRN: [id number]  CC -- Lt Groin Pain  HPI Patient has  Had left groin pain since Aug.  Only trauma was earlier and she is not sure whether the groin was injured. Hx of hypermobility   Trauma:  A month or two before had fallen in a hole.  After that had shoulder pain. More popping of left shoulder. She had surgery for this in fall and is now recovering. (Dr Dion Saucier)  Groin pain not very common during recovery from shoulder surgery. Over past 6 weeks walking and exercising more. Groin pain now hurts with adduction. Pain with too much groin stretch. No popping or instability.  Weaning off Mobic did not make much change. Pattern is intermittent Too much walking will trigger sxs.  Most time pain is 1/10. If she gets a flare it is 5 to 6/10  Foot pain on last visit better with MT pads  Still some TTP and pressure at plantar MTP2  P/S/F Hx No smoking Married and works in office  ROS Radiates to left SIJ Stretches for SIJ help Rare numbness down ant thigh and leg  Exam NAD O x 3 BP 93/63 mmHg  Rt shoulder motion is now within 90% of left  Lt hip - full IR and ER Excellent strength on abduction in 3 planes Good hip flexion strength Adduction is weak - 4 /5 and triggers pain Sartorius testing - weak 4/5 and mild pain  SIJ Good mobility on ASIS rock Standing LT hemipelvis rotates anteriorly Able to reset with knee flexion to chest  Feet  Loss of long and tran arch TTP at base of MTP 2 Rt She has splaying and rotation of MTP jt 2 Rt

## 2015-12-29 ENCOUNTER — Ambulatory Visit (INDEPENDENT_AMBULATORY_CARE_PROVIDER_SITE_OTHER): Payer: Managed Care, Other (non HMO) | Admitting: Sports Medicine

## 2015-12-29 ENCOUNTER — Encounter: Payer: Self-pay | Admitting: Sports Medicine

## 2015-12-29 VITALS — BP 86/62 | Ht 64.0 in | Wt 118.0 lb

## 2015-12-29 DIAGNOSIS — M25512 Pain in left shoulder: Secondary | ICD-10-CM | POA: Diagnosis not present

## 2015-12-29 DIAGNOSIS — R1030 Lower abdominal pain, unspecified: Secondary | ICD-10-CM

## 2015-12-29 DIAGNOSIS — R1032 Left lower quadrant pain: Secondary | ICD-10-CM

## 2015-12-29 NOTE — Progress Notes (Signed)
Patient ID: Katherine DunkerMichelle A Wolfe, female   DOB: [birth year], 50 y.o.   MRN: [id number]  CC; left hip and left shoulder pain  The patient has Ehlers-Danlos type III Our last visit we treated her foot problems which have improved She had hip irritation and that improved initially with exercises for iliopsoas and sartorius She became more aggressive with the exercises including some high kicks The hip pain has returned and now has been limiting her from most exercise activity  She is status post a labrum repair of the right shoulder This is doing well although she has some limitation of motion on internal rotation and back scratch This keep her from being able to swim  Now she is concerned because the left shoulder is getting some sharp pains much like the right  Social history she is a nonsmoker  Review of systems No back pain No sciatic symptoms No numbness even into the left arm or into the left leg  Physical examination Thin female in no acute distress BP 86/62 mmHg  Ht 5\' 4"  (1.626 m)  Wt 118 lb (53.524 kg)  BMI 20.24 kg/m2  Left shoulder Shoulder: Inspection reveals no abnormalities, atrophy or asymmetry. However, she tends to be moving toward a forward roll shoulder with scapular protraction This gives her spasm over the left trapezius  Palpation is normal with no tenderness over AC joint or bicipital groove. ROM is full in all planes. Rotator cuff strength normal throughout. No signs of impingement with negative Neer and Hawkin's tests, empty can. Speeds and Yergason's tests normal. She is uncomfortable with apprehension sign She is not unstable on mobility testing No painful arc and no drop arm sign.  Left hip She has an extensive range of motion that totals about 150 and  is even with the right Good hip flexion hip abduction and hip rotation strength FADIR test reproduces pain FABER test non painful Iliopsoas testing in several positions is  unremarkable  Ultrasound of left hip No joint effusion Normal bony contour of the acetabulum and the femoral head and neck Iliopsoas tendon looks normal with no swelling Rectus femoris tendon looks normal Labrum shows one area with a small split but most of the labrum is intact anteriorly

## 2015-12-29 NOTE — Assessment & Plan Note (Signed)
I am concerned that she has labral pathology but I do not think it is severe  I want her to work with Pilates and I recommended Shepard GeneralDonna Gamble I think if she stabilizes pelvic and hip position Does rehabilitation exercises She may be able to resolve this

## 2015-12-29 NOTE — Assessment & Plan Note (Signed)
She has had trouble correcting the posture and a tendency toward scapular protraction  I recommended working with Pilates on shoulder position and scapular stabilization  I would like to reassess this in 6 weeks

## 2016-02-01 ENCOUNTER — Ambulatory Visit (INDEPENDENT_AMBULATORY_CARE_PROVIDER_SITE_OTHER): Payer: Managed Care, Other (non HMO) | Admitting: Sports Medicine

## 2016-02-01 ENCOUNTER — Encounter: Payer: Self-pay | Admitting: Sports Medicine

## 2016-02-01 VITALS — BP 93/38 | Ht 64.0 in | Wt 118.0 lb

## 2016-02-01 DIAGNOSIS — R1032 Left lower quadrant pain: Secondary | ICD-10-CM

## 2016-02-01 DIAGNOSIS — R1030 Lower abdominal pain, unspecified: Secondary | ICD-10-CM | POA: Diagnosis not present

## 2016-02-01 DIAGNOSIS — M25512 Pain in left shoulder: Secondary | ICD-10-CM

## 2016-02-01 NOTE — Assessment & Plan Note (Signed)
Left shoulder pain is continuing to bother her. This is likely from her hypermobilzation of the joint 2/2 to Ehler-Danlos syndrome. Her scapular stabilization is better now with Pilates.   - continue pilates - patient wants to start swimming, asked to do it gently. - f/up in 3 months.

## 2016-02-01 NOTE — Progress Notes (Signed)
   Subjective:    Patient ID: Katherine Wolfe, female    DOB: [birth year], 50 y.o.   MRN: [id number]  HPI  50 yo female with Ehlers-Danlos Type III here for follow up of left hip and left shoulder pain.  She has been doing Pilates every other day and also does stationary biking. She is sore today from heavy workout yesterday.   Left shoulder pain continues to bother her. It's similar to her right shoulder which had to be operated on because of labrum tear in the past. Arm crossover bothers are the most. Had some scapular protraction on last visit With Dr. Darrick PennaFields on 12/29/15.  Continues to have left hip pain which is slowly improving with pilates. Left hip ultrasound last visit showed small split of the labrum. Active ADduction and extension of the hip causes the pain.   Patient wants to taper off her Mobic as much as possible. Taking it weekly now.   Review of Systems  Constitutional: Negative for fever and chills.  Respiratory: Negative for chest tightness and shortness of breath.   Musculoskeletal: Positive for arthralgias. Negative for back pain, joint swelling and neck pain.  Neurological: Negative for weakness and numbness.       Objective:   Physical Exam  Constitutional: She appears well-developed and well-nourished. No distress.  Neck: Normal range of motion.  Musculoskeletal:  Has pain with FADIR on left hip.  Normal FABER. Knee ROM normal.   Right shoulder good ROM. Left shoulder good strength and ROM. Some pain with empty can test and O'Brien test.  Scapula has good stability today.   Skin: She is not diaphoretic.   Note better postural position Also no scapular winging or dysfunction today  Filed Vitals:   02/01/16 0945  BP: 93/38        Assessment & Plan:  See problem based a&p

## 2016-02-01 NOTE — Assessment & Plan Note (Signed)
Continue to have left groin pain mainly caused by ADduction and flexion. This is likely 2/2 to mild labral pathology as seen on ultrasound (small split) on 12/29/15.  She has improved since last visit.   - continue piliates to stabilize her pelvic and hip position further.  - continue Mobic PRN.

## 2016-03-09 IMAGING — CR DG CHEST 2V
2 series · 2 of 2 positions shown · non-contrast
Comparison: None.

CLINICAL DATA: 48-year-old female with severe headache in confusion
for 1 day. Former smoker. Initial encounter.

EXAM:
CHEST  2 VIEW

[chest pa]
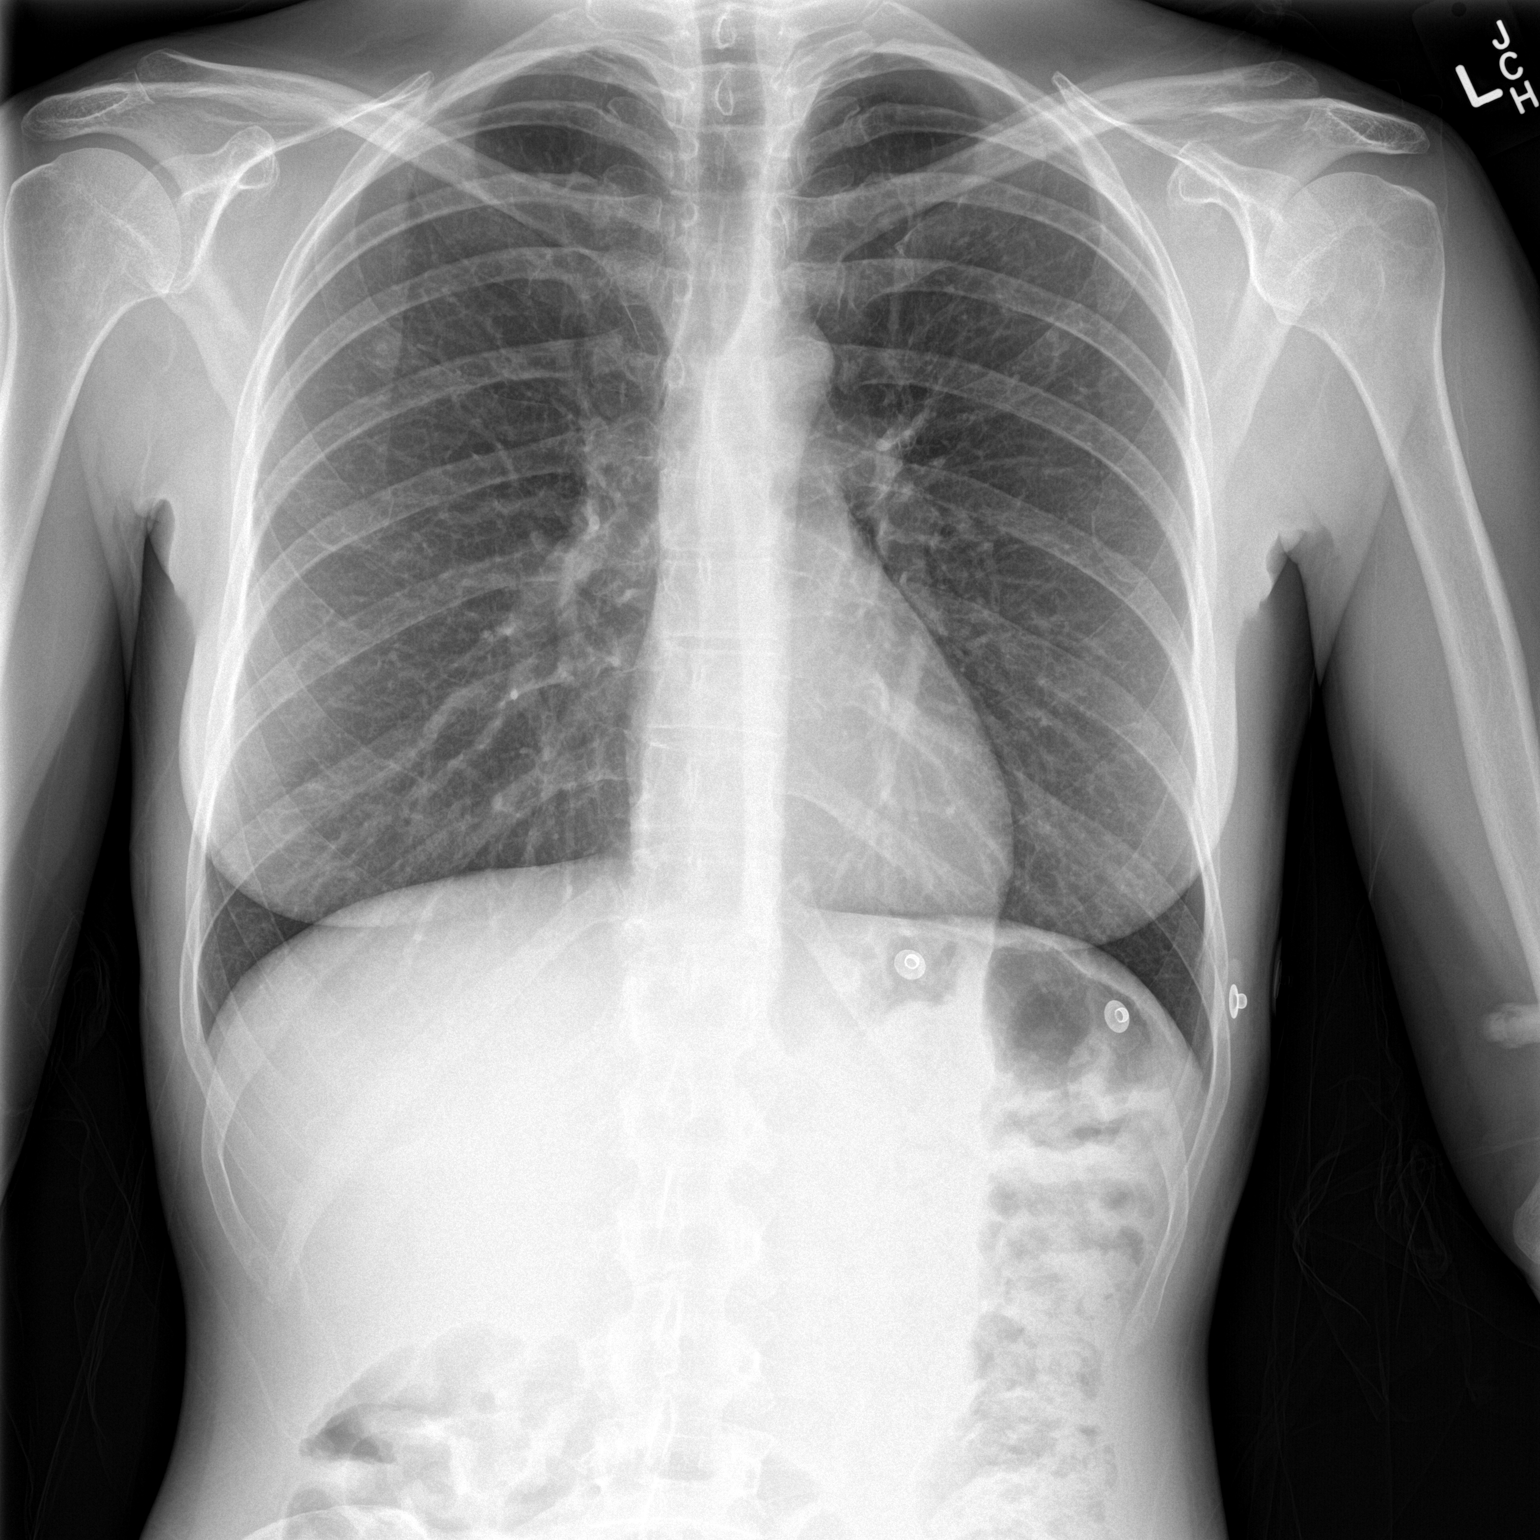

[chest lat]
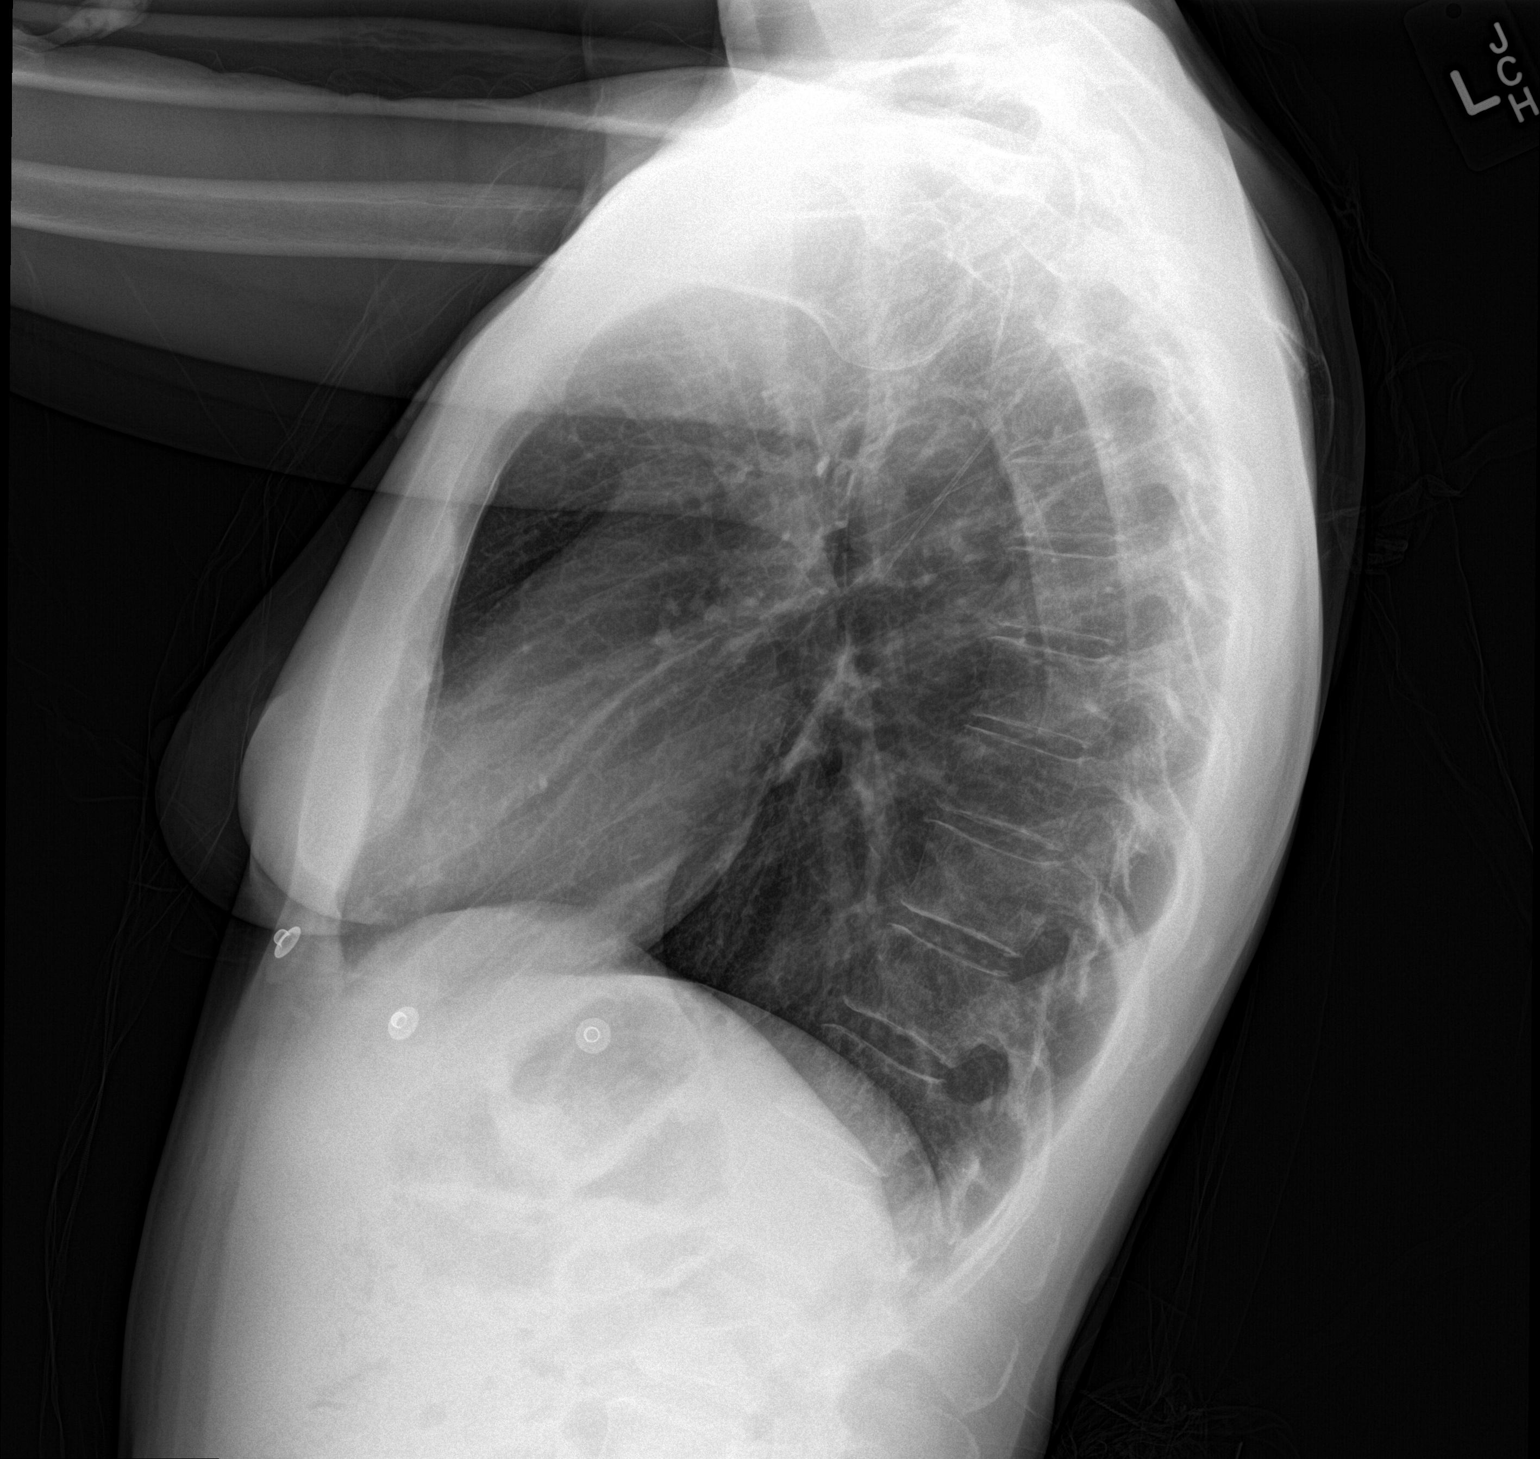

[2 of 2 positions shown; findings below may reference images not displayed]

FINDINGS: Lung volumes within normal limits. Normal cardiac size and
mediastinal contours. Visualized tracheal air column is within
normal limits. Minimal increased interstitial markings. EKG button
artifact. Otherwise the lungs are clear. No osseous abnormality
identified.
IMPRESSION: Negative, no acute cardiopulmonary abnormality.

## 2016-04-05 ENCOUNTER — Encounter: Payer: Self-pay | Admitting: Gynecology

## 2016-04-12 ENCOUNTER — Encounter: Payer: Self-pay | Admitting: Sports Medicine

## 2016-04-12 ENCOUNTER — Ambulatory Visit (INDEPENDENT_AMBULATORY_CARE_PROVIDER_SITE_OTHER): Payer: Managed Care, Other (non HMO) | Admitting: Sports Medicine

## 2016-04-12 VITALS — BP 96/50 | HR 57 | Ht 64.0 in | Wt 120.0 lb

## 2016-04-12 DIAGNOSIS — M25512 Pain in left shoulder: Secondary | ICD-10-CM | POA: Insufficient documentation

## 2016-04-12 DIAGNOSIS — R1030 Lower abdominal pain, unspecified: Secondary | ICD-10-CM

## 2016-04-12 DIAGNOSIS — R1032 Left lower quadrant pain: Secondary | ICD-10-CM

## 2016-04-12 NOTE — Assessment & Plan Note (Addendum)
-   No signs of impingnment, rotator cuff injury  - Exercise awareness to ensure no instability when exercising. Pain likely positional to exercise.  - Continue pilates  She needs to work on swimming motion to see if she can find a pain free type of stroke I think her breaststroke may be a problem

## 2016-04-12 NOTE — Assessment & Plan Note (Addendum)
Improving. Patient should continue pilates for strengthen and follow up as needed   More stable than in the past and pain is less on testing  Avoid elliptical as this bothers her hip Okay to continue some walking and weightbearing exercise Biking is excellent

## 2016-04-12 NOTE — Patient Instructions (Addendum)
Continue Pilate's. Be careful with swimming stroke, watch for vulnerable positions.  Please follow up as needed in 3- 4 months or sooner if you having any issues.

## 2016-04-12 NOTE — Progress Notes (Signed)
   [facility] Noralee CharsAsiyah Mylon Mabey, MD Phone: [phone]  Reason For Visit: Follow up left hip and shoulder  Patient has Ehlers-Danlos type III  # Left shoulder - Ongoing pain in left shoulder since September 2016 - Has been going to PT pilates, have been doing light exercises - Painful with swimming, especially stroke  - Has improved some, does not click as did initially   #Left Hip- Labral fraying in left hip - No longer clicking, has improved, since August 2016.  -  Patient doing pilates once weekly for strengthen    - Has improved her ability to swim, however not where she was at before   ROS No neck pain No radicular symptoms into the left shoulder or arm Right shoulder is not painful with swimming   Objective: BP 96/50 mmHg  Pulse 57  Ht 5\' 4"  (1.626 m)  Wt 120 lb (54.432 kg)  BMI 20.59 kg/m2 Gen: NAD, alert, cooperative with exam  Left shoulder Inspection reveals no abnormalities, atrophy or asymmetry. Normal ROM  Rotator cuff strength normal throughout. No signs of impingement with negative Neer and Hawkin's tests, empty can. Speeds and Yergason's tests normal.  Today her mobility is only slightly increased on the sulcus exam and  translation of the humerus She has mild apprehension O'Brien's test was negative Jobes relocation test revealed good relief with anterior pressure but no clicking or instability  Left Hip  Hypermobile, though no instability  FADIR test positive for pain FABER test negative  Both hips breech about 150-160 total rotation while lying at 90/90 position  Assessment/Plan: See problem based a/p  Left groin pain Improving. Patient should continue pilates for strengthen and follow up as needed   Left shoulder pain - No signs of impingnment, rotator cuff injury  - Exercise awareness to ensure no instability when exercising. Pain likely positional to exercise.  - Continue pilate

## 2016-05-19 ENCOUNTER — Encounter: Payer: No Typology Code available for payment source | Admitting: Gynecology

## 2016-06-06 ENCOUNTER — Encounter: Payer: Self-pay | Admitting: Gynecology

## 2016-06-06 ENCOUNTER — Ambulatory Visit (INDEPENDENT_AMBULATORY_CARE_PROVIDER_SITE_OTHER): Payer: Managed Care, Other (non HMO) | Admitting: Gynecology

## 2016-06-06 VITALS — BP 110/64 | Ht 64.0 in | Wt 123.0 lb

## 2016-06-06 DIAGNOSIS — N951 Menopausal and female climacteric states: Secondary | ICD-10-CM | POA: Diagnosis not present

## 2016-06-06 DIAGNOSIS — Z01419 Encounter for gynecological examination (general) (routine) without abnormal findings: Secondary | ICD-10-CM | POA: Diagnosis not present

## 2016-06-06 DIAGNOSIS — R102 Pelvic and perineal pain: Secondary | ICD-10-CM | POA: Diagnosis not present

## 2016-06-06 DIAGNOSIS — N926 Irregular menstruation, unspecified: Secondary | ICD-10-CM

## 2016-06-06 MED ORDER — MEDROXYPROGESTERONE ACETATE 10 MG PO TABS: 10.0000 mg | ORAL_TABLET | Freq: Every day | ORAL | 0 refills | Status: DC

## 2016-06-06 NOTE — Progress Notes (Signed)
JEYMI HEPP Jul 14, 1966 [id number]        50 y.o.  G0P0  for annual exam.  Several issues noted below.  Past medical history,surgical history, problem list, medications, allergies, family history and social history were all reviewed and documented as reviewed in the EPIC chart.  ROS:  Performed with pertinent positives and negatives included in the history, assessment and plan.   Additional significant findings :  None   Exam: Kennon Portela assistant Vitals:   06/06/16 1603  BP: 110/64  Weight: 123 lb (55.8 kg)  Height: 5\' 4"  (1.626 m)   Body mass index is 21.11 kg/m.  General appearance:  Normal affect, orientation and appearance. Skin: Grossly normal HEENT: Without gross lesions.  No cervical or supraclavicular adenopathy. Thyroid normal.  Lungs:  Clear without wheezing, rales or rhonchi Cardiac: RR, without RMG Abdominal:  Soft, nontender, without masses, guarding, rebound, organomegaly or hernia Breasts:  Examined lying and sitting without masses, retractions, discharge or axillary adenopathy. Pelvic:  Ext/BUS/Vagina normal  Cervix normal  Uterus anteverted, normal size, shape and contour, midline and mobile nontender   Adnexa without masses or tenderness    Anus and perineum normal   Rectovaginal normal sphincter tone without palpated masses or tenderness.    Assessment/Plan:  49 y.o. G0P0 female for annual exam with irregular menses, condom contraception.   1. Irregular menses/menopausal symptoms. Patient skips up to several months and then has 2 menses in a month intermittently. Also has been spotting on and off since her last period started 05/17/2016. Has started to develop significant hot flushes and sweats. I reviewed the perimenopausal time frame with her and that this certainly all fits at age 89. Various options were reviewed to include menstrual regulation/hormone replacement such as low-dose oral contraceptives. She does have a history of migraines to  include vasoactive phenomenon such as transient ischemic changes with her migraines and I do not think birth control pills would be wise. Low-dose HRT particularly with transdermal administration discussed although again she would have to accept the thrombosis risk with her history. She is not interested in HRT at this point. Alternative OTC products such as soy based also reviewed and she wants to go ahead and try these first. From an irregular bleeding standpoint am going to try a trial of Provera 10 mg daily 10 days. She'll keep a menstrual calendar afterwards and as long as less frequent but fairly regular menses when they occur will monitor. If significant irregular bleeding or prolonged bleeding she'll represent for further evaluation. 2. Left pelvic pain. Actively being seen by Dr. Darrick Penna for torn ligament in her left hip and undergoing physical therapy.  She also notes some intermittent left pelvic pain that she is not sure whether it's musculoskeletal. Sometimes seems worse with ovulation. No dyspareunia nausea vomiting diarrhea constipation or urinary tract symptoms. Will start with baseline ultrasound to rule out nonpalpable abnormalities. Assuming GYN ultrasound is normal then she'll continue to follow up for her physical therapy in reference to this. She does have a colonoscopy scheduled in 2 weeks as a screening. 3. Mammography 03/2016. Continue with annual mammography when due. SBE monthly reviewed. 4. Condom contraception. We discussed multiple times and she is comfortable with her condom contraception excepting the failure risks. 5. Pap smear/HPV 2015. No Pap smear done today. Does have a history of cryosurgery a number of years ago with normal Pap smears afterwards. Plan repeat Pap smear at 5 year interval per current screening guidelines. 6. Health  maintenance. No routine blood work done as this is done at Dr. Mickie KayMcNeil's office. Follow up for ultrasound and response to the Provera withdrawal.  Follow up in one year, sooner as needed.  15 minutes of my time in excess of her routine GYN exam was spent in direct face to face counseling and coordination of care in regards to her problems of irregular menses, menopausal symptoms, pelvic pain.    Dara LordsFONTAINE,Mayra Jolliffe P MD, 4:49 PM 06/06/2016

## 2016-06-06 NOTE — Patient Instructions (Signed)
Take the progesterone medication daily for 10 days.  Follow up for the ultrasound as scheduled.  You may obtain a copy of any labs that were done today by logging onto MyChart as outlined in the instructions provided with your AVS (after visit summary). The office will not call with normal lab results but certainly if there are any significant abnormalities then we will contact you.   Health Maintenance Adopting a healthy lifestyle and getting preventive care can go a long way to promote health and wellness. Talk with your health care provider about what schedule of regular examinations is right for you. This is a good chance for you to check in with your provider about disease prevention and staying healthy. In between checkups, there are plenty of things you can do on your own. Experts have done a lot of research about which lifestyle changes and preventive measures are most likely to keep you healthy. Ask your health care provider for more information. WEIGHT AND DIET  Eat a healthy diet  Be sure to include plenty of vegetables, fruits, low-fat dairy products, and lean protein.  Do not eat a lot of foods high in solid fats, added sugars, or salt.  Get regular exercise. This is one of the most important things you can do for your health.  Most adults should exercise for at least 150 minutes each week. The exercise should increase your heart rate and make you sweat (moderate-intensity exercise).  Most adults should also do strengthening exercises at least twice a week. This is in addition to the moderate-intensity exercise.  Maintain a healthy weight  Body mass index (BMI) is a measurement that can be used to identify possible weight problems. It estimates body fat based on height and weight. Your health care provider can help determine your BMI and help you achieve or maintain a healthy weight.  For females 38 years of age and older:   A BMI below 18.5 is considered underweight.  A  BMI of 18.5 to 24.9 is normal.  A BMI of 25 to 29.9 is considered overweight.  A BMI of 30 and above is considered obese.  Watch levels of cholesterol and blood lipids  You should start having your blood tested for lipids and cholesterol at 51 years of age, then have this test every 5 years.  You may need to have your cholesterol levels checked more often if:  Your lipid or cholesterol levels are high.  You are older than 51 years of age.  You are at high risk for heart disease.  CANCER SCREENING   Lung Cancer  Lung cancer screening is recommended for adults 26-28 years old who are at high risk for lung cancer because of a history of smoking.  A yearly low-dose CT scan of the lungs is recommended for people who:  Currently smoke.  Have quit within the past 15 years.  Have at least a 30-pack-year history of smoking. A pack year is smoking an average of one pack of cigarettes a day for 1 year.  Yearly screening should continue until it has been 15 years since you quit.  Yearly screening should stop if you develop a health problem that would prevent you from having lung cancer treatment.  Breast Cancer  Practice breast self-awareness. This means understanding how your breasts normally appear and feel.  It also means doing regular breast self-exams. Let your health care provider know about any changes, no matter how small.  If you are in your 31s  or 78s, you should have a clinical breast exam (CBE) by a health care provider every 1-3 years as part of a regular health exam.  If you are 37 or older, have a CBE every year. Also consider having a breast X-ray (mammogram) every year.  If you have a family history of breast cancer, talk to your health care provider about genetic screening.  If you are at high risk for breast cancer, talk to your health care provider about having an MRI and a mammogram every year.  Breast cancer gene (BRCA) assessment is recommended for women  who have family members with BRCA-related cancers. BRCA-related cancers include:  Breast.  Ovarian.  Tubal.  Peritoneal cancers.  Results of the assessment will determine the need for genetic counseling and BRCA1 and BRCA2 testing. Cervical Cancer Routine pelvic examinations to screen for cervical cancer are no longer recommended for nonpregnant women who are considered low risk for cancer of the pelvic organs (ovaries, uterus, and vagina) and who do not have symptoms. A pelvic examination may be necessary if you have symptoms including those associated with pelvic infections. Ask your health care provider if a screening pelvic exam is right for you.   The Pap test is the screening test for cervical cancer for women who are considered at risk.  If you had a hysterectomy for a problem that was not cancer or a condition that could lead to cancer, then you no longer need Pap tests.  If you are older than 65 years, and you have had normal Pap tests for the past 10 years, you no longer need to have Pap tests.  If you have had past treatment for cervical cancer or a condition that could lead to cancer, you need Pap tests and screening for cancer for at least 20 years after your treatment.  If you no longer get a Pap test, assess your risk factors if they change (such as having a new sexual partner). This can affect whether you should start being screened again.  Some women have medical problems that increase their chance of getting cervical cancer. If this is the case for you, your health care provider may recommend more frequent screening and Pap tests.  The human papillomavirus (HPV) test is another test that may be used for cervical cancer screening. The HPV test looks for the virus that can cause cell changes in the cervix. The cells collected during the Pap test can be tested for HPV.  The HPV test can be used to screen women 4 years of age and older. Getting tested for HPV can extend the  interval between normal Pap tests from three to five years.  An HPV test also should be used to screen women of any age who have unclear Pap test results.  After 51 years of age, women should have HPV testing as often as Pap tests.  Colorectal Cancer  This type of cancer can be detected and often prevented.  Routine colorectal cancer screening usually begins at 51 years of age and continues through 51 years of age.  Your health care provider may recommend screening at an earlier age if you have risk factors for colon cancer.  Your health care provider may also recommend using home test kits to check for hidden blood in the stool.  A small camera at the end of a tube can be used to examine your colon directly (sigmoidoscopy or colonoscopy). This is done to check for the earliest forms of colorectal cancer.  Routine screening usually begins at age 70.  Direct examination of the colon should be repeated every 5-10 years through 51 years of age. However, you may need to be screened more often if early forms of precancerous polyps or small growths are found. Skin Cancer  Check your skin from head to toe regularly.  Tell your health care provider about any new moles or changes in moles, especially if there is a change in a mole's shape or color.  Also tell your health care provider if you have a mole that is larger than the size of a pencil eraser.  Always use sunscreen. Apply sunscreen liberally and repeatedly throughout the day.  Protect yourself by wearing long sleeves, pants, a wide-brimmed hat, and sunglasses whenever you are outside. HEART DISEASE, DIABETES, AND HIGH BLOOD PRESSURE   Have your blood pressure checked at least every 1-2 years. High blood pressure causes heart disease and increases the risk of stroke.  If you are between 80 years and 2 years old, ask your health care provider if you should take aspirin to prevent strokes.  Have regular diabetes screenings. This  involves taking a blood sample to check your fasting blood sugar level.  If you are at a normal weight and have a low risk for diabetes, have this test once every three years after 51 years of age.  If you are overweight and have a high risk for diabetes, consider being tested at a younger age or more often. PREVENTING INFECTION  Hepatitis B  If you have a higher risk for hepatitis B, you should be screened for this virus. You are considered at high risk for hepatitis B if:  You were born in a country where hepatitis B is common. Ask your health care provider which countries are considered high risk.  Your parents were born in a high-risk country, and you have not been immunized against hepatitis B (hepatitis B vaccine).  You have HIV or AIDS.  You use needles to inject street drugs.  You live with someone who has hepatitis B.  You have had sex with someone who has hepatitis B.  You get hemodialysis treatment.  You take certain medicines for conditions, including cancer, organ transplantation, and autoimmune conditions. Hepatitis C  Blood testing is recommended for:  Everyone born from [birth year] through 1966.  Anyone with known risk factors for hepatitis C. Sexually transmitted infections (STIs)  You should be screened for sexually transmitted infections (STIs) including gonorrhea and chlamydia if:  You are sexually active and are younger than 51 years of age.  You are older than 51 years of age and your health care provider tells you that you are at risk for this type of infection.  Your sexual activity has changed since you were last screened and you are at an increased risk for chlamydia or gonorrhea. Ask your health care provider if you are at risk.  If you do not have HIV, but are at risk, it may be recommended that you take a prescription medicine daily to prevent HIV infection. This is called pre-exposure prophylaxis (PrEP). You are considered at risk if:  You are  sexually active and do not regularly use condoms or know the HIV status of your partner(s).  You take drugs by injection.  You are sexually active with a partner who has HIV. Talk with your health care provider about whether you are at high risk of being infected with HIV. If you choose to begin PrEP, you should first  be tested for HIV. You should then be tested every 3 months for as long as you are taking PrEP.  PREGNANCY   If you are premenopausal and you may become pregnant, ask your health care provider about preconception counseling.  If you may become pregnant, take 400 to 800 micrograms (mcg) of folic acid every day.  If you want to prevent pregnancy, talk to your health care provider about birth control (contraception). OSTEOPOROSIS AND MENOPAUSE   Osteoporosis is a disease in which the bones lose minerals and strength with aging. This can result in serious bone fractures. Your risk for osteoporosis can be identified using a bone density scan.  If you are 60 years of age or older, or if you are at risk for osteoporosis and fractures, ask your health care provider if you should be screened.  Ask your health care provider whether you should take a calcium or vitamin D supplement to lower your risk for osteoporosis.  Menopause may have certain physical symptoms and risks.  Hormone replacement therapy may reduce some of these symptoms and risks. Talk to your health care provider about whether hormone replacement therapy is right for you.  HOME CARE INSTRUCTIONS   Schedule regular health, dental, and eye exams.  Stay current with your immunizations.   Do not use any tobacco products including cigarettes, chewing tobacco, or electronic cigarettes.  If you are pregnant, do not drink alcohol.  If you are breastfeeding, limit how much and how often you drink alcohol.  Limit alcohol intake to no more than 1 drink per day for nonpregnant women. One drink equals 12 ounces of beer, 5  ounces of wine, or 1 ounces of hard liquor.  Do not use street drugs.  Do not share needles.  Ask your health care provider for help if you need support or information about quitting drugs.  Tell your health care provider if you often feel depressed.  Tell your health care provider if you have ever been abused or do not feel safe at home. Document Released: 04/10/2011 Document Revised: 02/09/2014 Document Reviewed: 08/27/2013 [facility] Patient Information 2015 [place], [place]. This information is not intended to replace advice given to you by your health care provider. Make sure you discuss any questions you have with your health care provider.

## 2016-06-07 ENCOUNTER — Telehealth: Payer: Self-pay

## 2016-06-07 NOTE — Telephone Encounter (Signed)
I called patient to let her know that [facility] is filed towards her deductible. Since she has met ded and has no co-ins it should pay her claim at 100%. No prior auth require. Valid and billable codes. Per John call ref# [id number]

## 2016-06-28 ENCOUNTER — Encounter: Payer: Self-pay | Admitting: Gynecology

## 2016-06-28 ENCOUNTER — Other Ambulatory Visit: Payer: Self-pay | Admitting: Gynecology

## 2016-06-28 ENCOUNTER — Ambulatory Visit (INDEPENDENT_AMBULATORY_CARE_PROVIDER_SITE_OTHER): Payer: Managed Care, Other (non HMO)

## 2016-06-28 ENCOUNTER — Ambulatory Visit (INDEPENDENT_AMBULATORY_CARE_PROVIDER_SITE_OTHER): Payer: Managed Care, Other (non HMO) | Admitting: Gynecology

## 2016-06-28 VITALS — BP 120/76

## 2016-06-28 DIAGNOSIS — R102 Pelvic and perineal pain: Secondary | ICD-10-CM | POA: Diagnosis not present

## 2016-06-28 DIAGNOSIS — R9389 Abnormal findings on diagnostic imaging of other specified body structures: Secondary | ICD-10-CM

## 2016-06-28 DIAGNOSIS — N926 Irregular menstruation, unspecified: Secondary | ICD-10-CM | POA: Diagnosis not present

## 2016-06-28 DIAGNOSIS — R938 Abnormal findings on diagnostic imaging of other specified body structures: Secondary | ICD-10-CM

## 2016-06-28 MED ORDER — MEDROXYPROGESTERONE ACETATE 10 MG PO TABS: 10.0000 mg | ORAL_TABLET | Freq: Every day | ORAL | 4 refills | Status: DC

## 2016-06-28 NOTE — Progress Notes (Signed)
    Barrie DunkerMichelle A Haggard Dec 02, 1965 [id number]        50 y.o.  G0P0 presents for ultrasound. History of menstrual irregularity consistent with perimenopausal timeframe.  Had prolonged bleeding after her last menses I gave her a Provera withdrawal 10 mg 10 days. She's completed this and the bleeding has resolved. Is also having pelvic discomfort which she is being followed by Dr. Darrick PennaFields for torn ligament. The ultrasound was ordered to rule out pelvic pathology as a source of her pain.  Past medical history,surgical history, problem list, medications, allergies, family history and social history were all reviewed and documented in the EPIC chart.  Directed ROS with pertinent positives and negatives documented in the history of present illness/assessment and plan.  Exam: Vitals:   06/28/16 1247  BP: 120/76   General appearance:  Normal  Ultrasound shows uterus normal size and echotexture. Endometrial echo 6.6 mm. Small echogenic area 14 x 5 mm. Left ovary normal with follicular development. Right ovary with small calcified area 4 mm. Cul-de-sac negative  Assessment/Plan:  50 y.o. G0P0 with pelvic pain and normal ultrasound from a ovarian pathology standpoint. I think her pain is due to the torn ligament she's going to pursue evaluation with Dr. Darrick PennaFields. She did have a colonoscopy last week which was normal.  small echogenic/calcified focus in the right ovary I think is physiologic.  she does have a echogenic area in the endometrium. She also has a history of benign endometrial polyps in 2009. Various options were reviewed and my recommendation was to have several cycles of withdrawals either spontaneous or progesterone induced and then follow up sonohysterogram in 3 months to see if this area persists. Patient is somewhat reluctant to do this. I discussed with her that given her clinical scenario unlikely that this is a malignant or premalignant condition and this may either represent a physiologic  change that will clear with several's withdrawal cycles or recurrence of her benign endometrial polyps. The issue though is if followed up and seen on sonohysterogram I would recommend hysteroscopic resection. If not further evaluated then she would accept the risk that it would be a premalignant/malignant condition.  At this point the patient prefers not to proceed with that but would like several months of withdrawal bleeds and then follow herself clinically and if with no irregular bleeding she prefers observation at this time clearly understanding possibility of pre-/overt malignant changes. We'll plan on Provera 10 mg 10 days if without spontaneous menses every 6-8 weeks 3 cycles and then we'll go from there.    Dara LordsFONTAINE,Madelena Maturin P MD, 1:08 PM 06/28/2016

## 2016-06-28 NOTE — Patient Instructions (Signed)
Follow up if irregular bleeding continues or you decide to proceed with the sonohysterogram ultrasound study

## 2016-07-05 ENCOUNTER — Ambulatory Visit (INDEPENDENT_AMBULATORY_CARE_PROVIDER_SITE_OTHER): Payer: Managed Care, Other (non HMO) | Admitting: Sports Medicine

## 2016-07-05 DIAGNOSIS — M357 Hypermobility syndrome: Secondary | ICD-10-CM

## 2016-07-05 DIAGNOSIS — Q796 Ehlers-Danlos syndrome: Secondary | ICD-10-CM | POA: Diagnosis not present

## 2016-07-05 DIAGNOSIS — M25512 Pain in left shoulder: Secondary | ICD-10-CM | POA: Diagnosis not present

## 2016-07-05 NOTE — Patient Instructions (Signed)
  Add some isometric in internal rotation - pec muscle - in 2 positions Do these at 30 deg and 60 deg or rotation Do 10 repeats for count of 10  Forward isometric lifts 10 x 10 secs  Arnica gel try using twice daily massage into area around axilla  Keep working on posture - doing really well  Keep up the walking  See me in 3 months

## 2016-07-05 NOTE — Assessment & Plan Note (Signed)
Currently able to control pain sufficiently that she is only using intermittent medication  She can add additional medications if needed but currently would stay on a when necessary dosage

## 2016-07-05 NOTE — Progress Notes (Signed)
Followup of left shoulder and left hip pain  Patient with Ehlers-Danlos syndrome She is status post a labral repair and biceps tendon basis on the right shoulder The shoulder his seems stable over the past year and is causing less pain  Left shoulder is currently painful frequently She cannot do most swimming strokes She gets pain if she reaches behind her She gets clicking in the left shoulder  Working with physical therapy and Pilates she has improved her posture and feels that she's improve the strength and stability the shoulder However, she still gets pain with trying too much activity on left shoulder  Left hip Based on prior exam we think she has labral pathology This has improved with Pilates Biking seems to help this as well  Past history She is a complicated migraine and medicine were symptoms when she tried topical nitroglycerin Multiple autonomic and musculoskeletal problems related to Lorinda CreedEhlers Danlos  Review of systems No sciatica in the left leg No nighttime pain  Physical examination Pleasant white female in no acute distress BP (!) 100/46   Ht 5\' 4"  (1.626 m)   Wt 119 lb 9.6 oz (54.3 kg)   LMP 06/19/2016   BMI 20.53 kg/m   RT shoulder Normal range of motion and no evidence lax as right Good strength with rotator cuff testing No pain on biceps or labral testing  Left shoulder Increased laxity Positive looks sign with subluxation on Jobst test Jobes relocation test gives her less pain Good rotator cuff strength Positive O'Briens  Left hip Increased range of motion Pelvic forward position that is reducible Positive FADIR

## 2016-07-05 NOTE — Assessment & Plan Note (Signed)
She has improved her posture Strength is good Labral testing is still positive  Start isometric exercises Continue with physical therapy and Pilates  I would hesitate to do surgery unless it becomes necessary  I spent 30 minutes with this patient. Over 50% of visit was spend in counseling and coordination of care for problems with joint instability.

## 2016-08-15 ENCOUNTER — Ambulatory Visit (INDEPENDENT_AMBULATORY_CARE_PROVIDER_SITE_OTHER): Payer: Managed Care, Other (non HMO) | Admitting: Sports Medicine

## 2016-08-15 ENCOUNTER — Encounter: Payer: Self-pay | Admitting: Sports Medicine

## 2016-08-15 VITALS — BP 102/88 | HR 60 | Ht 64.0 in | Wt 119.0 lb

## 2016-08-15 DIAGNOSIS — M25512 Pain in left shoulder: Secondary | ICD-10-CM

## 2016-08-15 DIAGNOSIS — M357 Hypermobility syndrome: Secondary | ICD-10-CM | POA: Diagnosis not present

## 2016-08-15 DIAGNOSIS — G8929 Other chronic pain: Secondary | ICD-10-CM

## 2016-08-15 MED ORDER — METHYLPREDNISOLONE ACETATE 40 MG/ML IJ SUSP
40.0000 mg | Freq: Once | INTRAMUSCULAR | Status: AC
  Administered 2016-08-15: 40 mg via INTRA_ARTICULAR

## 2016-08-15 NOTE — Patient Instructions (Signed)
Relative rest of L shoulder for 6 weeks - ok to do normal daily activities, but no exercise of L shoulder  After 1 week, easy motion of the shoulder to keep from getting stiff  Aerobic exercise 30 min per day at least 5 days per week  When numbness wears off, shoulder can ache more for 2 days Can use ice for comfort  At the end of 6 weeks, unless shoulder is worse, come back for follow-up and can start exercise program

## 2016-08-15 NOTE — Progress Notes (Signed)
  Katherine Wolfe - 50 y.o. female MRN [id number]  Date of birth: 1966/04/06  SUBJECTIVE:  Including CC & ROS.   Katherine Wolfe presents today for follow-up on L shoulder pain.  Since last visit (9/27), shoulder pain has worsened.  Pain is aggravated with any activity including putting on coat.  She stopped HEP last week, as it was hurting for ~2 days after doing exercises.  She has been working with PT once weekly, but also hurts after therapy.  She is using arnica gel, voltaren gel and occasionally Mobic with some improvement.  Pain is felt in anterior and posterior L shoulder.  She does PT with [facility] who advised her to stop shoulder program and come for reeval  HISTORY: Past Medical, Surgical, Social, and Family History Reviewed & Updated per EMR.   Pertinent Historical Findings include: PMSHx -  Ehlers-Danlos syndrome, s/p R labral repair and biceps tendon repair, h/o complex migraine while using nitro patches PSHx - former smoker   DATA REVIEWED: none  PHYSICAL EXAM:  VS: BP:102/88  HR:60bpm  TEMP: ( )  RESP:   HT:5\' 4"  (162.6 cm)   WT:119 lb (54 kg)  BMI:20.5 PHYSICAL EXAM: Gen: NAD, alert, cooperative with exam, well-appearing HEENT: clear conjunctiva, EOMI CV:  no edema, capillary refill brisk  Resp: non-labored, normal speech Skin: no rashes, normal turgor  Neuro: no gross deficits.  Psych:  alert and oriented L shoulder: increased laxity in ROM, negative Hawkins and empty can testing, + OBriens, good rotator cuff strength, no TTP  L shoulder US: rotator cuff muscles and tendons all intact, posterior labrum intact, biceps tendon intact  ASSESSMENT & PLAN:   Left shoulder pain No signs of impingement or rotator cuff injury Exam concerning for labral tear - likely SLAP lesion Corticosteroid injection today Relative rest with no exercise of L shoulder x6wks F/u in 6 wks and start HEP if no worse Can consider MRI arthrogram and discussion with Dr Osie BondLandeau in  the future if no improvement with corticosteroid injection   Erasmo DownerAngela M Bacigalupo, MD, MPH PGY-3,  [facility] 08/15/2016 2:58 PM  I observed and examined the patient with the resident and agree with assessment and plan.  Note reviewed and modified by me. Enid BaasKarl Fields, MD

## 2016-08-15 NOTE — Assessment & Plan Note (Addendum)
No signs of impingement or rotator cuff injury Exam concerning for labral tear - likely SLAP lesion The hypermobility is likely the cause  Procedure:  Injection of Left shoulder Consent obtained and verified. Time-out conducted. Noted no overlying erythema, induration, or other signs of local infection. Skin prepped in a sterile fashion. Topical analgesic spray: Ethyl chloride. Completed without difficulty. We used ultrasound guidance to locate the intra-articular space and did an intra-articular injection. Meds: 4 mL lidocaine 1% and 1 mL of Solu-Medrol Pain immediately improved suggesting accurate placement of the medication. Advised to call if fevers/chills, erythema, induration, drainage, or persistent bleeding.   Corticosteroid injection today Relative rest with no exercise of L shoulder x6wks F/u in 6 wks and start HEP if no worse Can consider MRI arthrogram and discussion with Dr Osie BondLandeau in the future if no improvement with corticosteroid injection

## 2016-09-26 ENCOUNTER — Ambulatory Visit: Payer: Managed Care, Other (non HMO) | Admitting: Sports Medicine

## 2017-03-28 ENCOUNTER — Encounter: Payer: Self-pay | Admitting: Gynecology

## 2017-07-18 ENCOUNTER — Encounter: Payer: Self-pay | Admitting: Gynecology

## 2017-07-18 ENCOUNTER — Ambulatory Visit (INDEPENDENT_AMBULATORY_CARE_PROVIDER_SITE_OTHER): Payer: 59 | Admitting: Gynecology

## 2017-07-18 VITALS — BP 112/64 | Ht 64.25 in | Wt 122.0 lb

## 2017-07-18 DIAGNOSIS — N926 Irregular menstruation, unspecified: Secondary | ICD-10-CM | POA: Diagnosis not present

## 2017-07-18 DIAGNOSIS — N951 Menopausal and female climacteric states: Secondary | ICD-10-CM

## 2017-07-18 DIAGNOSIS — Z01419 Encounter for gynecological examination (general) (routine) without abnormal findings: Secondary | ICD-10-CM

## 2017-07-18 NOTE — Patient Instructions (Signed)
Follow up in one year for annual exam.  Follow up sooner if your menopausal symptoms worsen and you want to discuss treatment options or if you have significant atypical bleeding.

## 2017-07-18 NOTE — Progress Notes (Signed)
    Katherine Wolfe 12-08-1965 [id number]        51 y.o.  G0P0 for annual gynecologic exam.   Past medical history,surgical history, problem list, medications, allergies, family history and social history were all reviewed and documented as reviewed in the EPIC chart.  ROS:  Performed with pertinent positives and negatives included in the history, assessment and plan.   Additional significant findings :  None   Exam: Kennon Portela assistant Vitals:   07/18/17 1157  BP: 112/64  Weight: 122 lb (55.3 kg)  Height: 5' 4.25" (1.632 m)   Body mass index is 20.78 kg/m.  General appearance:  Normal affect, orientation and appearance. Skin: Grossly normal HEENT: Without gross lesions.  No cervical or supraclavicular adenopathy. Thyroid normal.  Lungs:  Clear without wheezing, rales or rhonchi Cardiac: RR, without RMG Abdominal:  Soft, nontender, without masses, guarding, rebound, organomegaly or hernia Breasts:  Examined lying and sitting without masses, retractions, discharge or axillary adenopathy. Pelvic:  Ext, BUS, Vagina: Normal  Cervix: Normal  Uterus: Anteverted, normal size, shape and contour, midline and mobile nontender   Adnexa: Without masses or tenderness    Anus and perineum: Normal   Rectovaginal: Normal sphincter tone without palpated masses or tenderness.    Assessment/Plan:  51 y.o. G0P0 female for annual gynecologic exam with irregular menses, condom contraception.   1. Menopausal symptoms/irregular menses. Continues with hot flushes and sweats. Also less frequent menses with LMP in July. They are normal length when they occur with no prolonged or atypical bleeding. I again reviewed the perimenopausal time frame with her and options for management. We again discussed the HRT issue when she is not interested. She prefers just to monitor at this time. If she has prolonged or atypical bleeding or goes more than one year without bleeding and then bleeds she knows to call.  She'll follow up sooner if her symptoms worsen and she wants to rediscuss treatment options. 2. History of pelvic/back pain previously. Followed by Dr. Darrick Penna with physical therapy and resolution of her pain. 3. Mammography 03/2017. Continue with annual mammography when due. Breast exam normal today. 4. Pap smear/HPV 2015. No Pap smear done today. History of cryosurgery number of years ago with normal Pap smears since then. Will plan repeat Pap smear approaching 5 year interval per current screening guidelines. 5. Colonoscopy 2017. Repeat at their recommended interval. 6. Health maintenance. No routine lab work done as she is going to do this at Dr. Mickie Kay office. Follow up 1 year, sooner as needed.   Katherine Lords MD, 12:26 PM 07/18/2017

## 2017-07-24 DIAGNOSIS — H1013 Acute atopic conjunctivitis, bilateral: Secondary | ICD-10-CM | POA: Diagnosis not present

## 2017-09-04 DIAGNOSIS — H1013 Acute atopic conjunctivitis, bilateral: Secondary | ICD-10-CM | POA: Diagnosis not present

## 2017-11-16 DIAGNOSIS — E559 Vitamin D deficiency, unspecified: Secondary | ICD-10-CM | POA: Diagnosis not present

## 2017-11-16 DIAGNOSIS — M85851 Other specified disorders of bone density and structure, right thigh: Secondary | ICD-10-CM | POA: Diagnosis not present

## 2017-11-16 DIAGNOSIS — Z13220 Encounter for screening for lipoid disorders: Secondary | ICD-10-CM | POA: Diagnosis not present

## 2017-11-16 DIAGNOSIS — M25569 Pain in unspecified knee: Secondary | ICD-10-CM | POA: Diagnosis not present

## 2017-11-16 DIAGNOSIS — Z0000 Encounter for general adult medical examination without abnormal findings: Secondary | ICD-10-CM | POA: Diagnosis not present

## 2017-11-16 DIAGNOSIS — L719 Rosacea, unspecified: Secondary | ICD-10-CM | POA: Diagnosis not present

## 2017-11-16 DIAGNOSIS — Z131 Encounter for screening for diabetes mellitus: Secondary | ICD-10-CM | POA: Diagnosis not present

## 2017-11-16 DIAGNOSIS — J4520 Mild intermittent asthma, uncomplicated: Secondary | ICD-10-CM | POA: Diagnosis not present

## 2017-12-24 DIAGNOSIS — H1013 Acute atopic conjunctivitis, bilateral: Secondary | ICD-10-CM | POA: Diagnosis not present

## 2017-12-24 DIAGNOSIS — H0289 Other specified disorders of eyelid: Secondary | ICD-10-CM | POA: Diagnosis not present

## 2018-01-14 DIAGNOSIS — M8588 Other specified disorders of bone density and structure, other site: Secondary | ICD-10-CM | POA: Diagnosis not present

## 2018-02-11 DIAGNOSIS — H0289 Other specified disorders of eyelid: Secondary | ICD-10-CM | POA: Diagnosis not present

## 2018-02-11 DIAGNOSIS — H1013 Acute atopic conjunctivitis, bilateral: Secondary | ICD-10-CM | POA: Diagnosis not present

## 2018-03-25 DIAGNOSIS — H1013 Acute atopic conjunctivitis, bilateral: Secondary | ICD-10-CM | POA: Diagnosis not present

## 2018-03-29 DIAGNOSIS — L719 Rosacea, unspecified: Secondary | ICD-10-CM | POA: Diagnosis not present

## 2018-03-29 DIAGNOSIS — D1801 Hemangioma of skin and subcutaneous tissue: Secondary | ICD-10-CM | POA: Diagnosis not present

## 2018-03-29 DIAGNOSIS — D225 Melanocytic nevi of trunk: Secondary | ICD-10-CM | POA: Diagnosis not present

## 2018-03-29 DIAGNOSIS — L821 Other seborrheic keratosis: Secondary | ICD-10-CM | POA: Diagnosis not present

## 2018-03-29 DIAGNOSIS — Z86018 Personal history of other benign neoplasm: Secondary | ICD-10-CM | POA: Diagnosis not present

## 2018-04-05 ENCOUNTER — Encounter: Payer: Self-pay | Admitting: Gynecology

## 2018-04-05 DIAGNOSIS — Z1231 Encounter for screening mammogram for malignant neoplasm of breast: Secondary | ICD-10-CM | POA: Diagnosis not present

## 2018-05-07 ENCOUNTER — Ambulatory Visit (INDEPENDENT_AMBULATORY_CARE_PROVIDER_SITE_OTHER): Payer: 59 | Admitting: Allergy and Immunology

## 2018-05-07 ENCOUNTER — Encounter: Payer: Self-pay | Admitting: Allergy and Immunology

## 2018-05-07 VITALS — BP 108/62 | HR 68 | Temp 98.0°F | Resp 18 | Ht 64.0 in | Wt 124.0 lb

## 2018-05-07 DIAGNOSIS — L718 Other rosacea: Secondary | ICD-10-CM | POA: Diagnosis not present

## 2018-05-07 DIAGNOSIS — J301 Allergic rhinitis due to pollen: Secondary | ICD-10-CM

## 2018-05-07 DIAGNOSIS — H1010 Acute atopic conjunctivitis, unspecified eye: Secondary | ICD-10-CM | POA: Diagnosis not present

## 2018-05-07 DIAGNOSIS — H04123 Dry eye syndrome of bilateral lacrimal glands: Secondary | ICD-10-CM

## 2018-05-07 DIAGNOSIS — T782XXA Anaphylactic shock, unspecified, initial encounter: Secondary | ICD-10-CM | POA: Diagnosis not present

## 2018-05-07 DIAGNOSIS — T7800XA Anaphylactic reaction due to unspecified food, initial encounter: Secondary | ICD-10-CM | POA: Diagnosis not present

## 2018-05-07 DIAGNOSIS — T63481A Toxic effect of venom of other arthropod, accidental (unintentional), initial encounter: Secondary | ICD-10-CM | POA: Diagnosis not present

## 2018-05-07 DIAGNOSIS — J4520 Mild intermittent asthma, uncomplicated: Secondary | ICD-10-CM

## 2018-05-07 MED ORDER — OLOPATADINE HCL 0.1 % OP SOLN: 1.0000 [drp] | Freq: Two times a day (BID) | OPHTHALMIC | 12 refills | Status: DC

## 2018-05-07 MED ORDER — MONTELUKAST SODIUM 10 MG PO TABS: 10.0000 mg | ORAL_TABLET | Freq: Every day | ORAL | 5 refills | Status: DC

## 2018-05-07 MED ORDER — EPINEPHRINE 0.3 MG/0.3ML IJ SOAJ: 0.3000 mg | Freq: Once | INTRAMUSCULAR | 1 refills | Status: AC

## 2018-05-07 NOTE — Patient Instructions (Addendum)
  1.  Allergen avoidance measures  2.  Attempt to avoid oral antihistamine use given dry eye syndrome  3.  Do not rub or touch eyes:  Use Systane eyedrops multiple times a day  4.  Treat and prevent inflammation:   A.  OTC Nasacort/Rhinocort 1 spray each nostril 3 times a week  B.  Montelukast 10 mg tablet 1 time per day  5.  If needed:   A.  Azelastine -1-2 sprays each nostril 1-2 times a day  B.  Auvi-Q 0.3, Benadryl, MD/ER evaluation for allergic reaction  C.  Proventil HFA or similar 2 inhalations every 4-6 hours  D.  Patanol - 1 drop each eye 2 times per day  6.  Continue topical ophthalmic cyclosporine and doxycycline for dry eye and ocular rosacea  7.  Consider a course of immunotherapy  8.  Return to clinic in 6 months or earlier if problem

## 2018-05-07 NOTE — Progress Notes (Addendum)
Dear Dr. Corliss Blacker,  Thank you for referring Katherine Wolfe to the [facility] [place] on 05/07/2018.   Below is a summation of this patient's evaluation and recommendations.  Thank you for your referral. I will keep you informed about this patient's response to treatment.   If you have any questions please do not hesitate to contact me.   Sincerely,  Jessica Priest, MD Allergy / Immunology [facility]   ______________________________________________________________________    NEW PATIENT NOTE  Referring Provider: Gweneth Dimitri, MD Primary Provider: Gweneth Dimitri, MD Date of office visit: 05/07/2018    Subjective:   Chief Complaint:  Katherine Wolfe (DOB: [birth year]) is a 52 y.o. female who presents to the clinic on 05/07/2018 with a chief complaint of Eyelid Problem and Allergic Rhinitis  .     HPI: Ellinor presents to this clinic in evaluation of multiple issues related to allergic disease.  First, she has a history of very significant eye irritation that became a significant issue in November 2017 and has been active since.  Apparently she has been diagnosed with allergic conjunctivitis and dry eye syndrome and ocular rosacea based upon evaluation with her ophthalmologist and evaluation with her dermatologist.  She has been treated with topical steroids and oral steroids and doxycycline and various other agents and she actually has better control of her eye issue at this point in time.  She still has foreign body feeling in her left eye which has always been the worst of the 2 eyes involved with this issue.  This spring was particularly bad for her eyes thus requiring the administration of systemic steroids to get this under control.  If she gets exposure to cats she develops very significant irritation and inflammation and swelling of her eyes.  Second, she does have some issues with a  little bit of nasal congestion and some sneezing during the spring for which she uses an antihistamine.  Third, she has a history of asthma that has been relatively inactive and she rarely uses a short acting bronchodilator averaging out to 1 time per year for the past several years.  She has no problem with exercise or cold air induced bronchospastic symptoms and has not required a systemic steroid to treat this condition in years.  Fourth, she has a reactivity directed against all tree nuts with the development of hives and stomach complaints and breathing problems.  Her last reaction was in April 2018 that did require the administration of an EpiPen after inadvertent administration of a tree nut and a salad.  Fifth, she has some form of delayed reactivity directed against vespid.  In 2005 she had a delayed reaction presenting as hives and shortness of breath several days after exposure that lasted approximately 1 week.  She has elected not to undergo therapy for the vespid allergy and relies on the use of an EpiPen at this point.  Past Medical History:  Diagnosis Date  . Anorexia    in high school, none since  . Asthma    well controlled  . Cervical dysplasia    and shoulder pain  . Knee pain, bilateral 11-12   SEES DR. FIELDS  . Migraine   . Rosacea     Past Surgical History:  Procedure Laterality Date  . DILATION AND CURETTAGE OF UTERUS    . GYNECOLOGIC CRYOSURGERY    . HYSTEROSCOPY  01/2008   D &  C  . KNEE SURGERY     Arthroscopic  . PELVIC LAPAROSCOPY  09/1999   right ovarian fibroma  . RHINOPLASTY    . ROTATOR CUFF REPAIR      Allergies as of 05/07/2018      Reactions   Other Swelling   TREE NUTS; WASPS, HORNETS AND YELLOW JACKETS      Medication List      adapalene 0.1 % gel Commonly known as:  DIFFERIN Apply 1 application topically at bedtime.   ALAWAY CHILDRENS ALLERGY 0.025 % ophthalmic solution Generic drug:  ketotifen Place 1 drop into both eyes 2 (two)  times daily.   CALCIUM + D PO Take 1 tablet by mouth daily.   co-enzyme Q-10 30 MG capsule Take 30 mg by mouth 3 (three) times daily.   cycloSPORINE 0.1 % Emul Apply 1 drop to eye 2 (two) times daily.   doxycycline 20 MG tablet Commonly known as:  PERIOSTAT Take 20 mg by mouth 2 (two) times daily.   EPIPEN 2-PAK 0.3 mg/0.3 mL Soaj injection Generic drug:  EPINEPHrine Inject 0.3 mg into the muscle as needed (allergic reaction).   fish oil-omega-3 fatty acids 1000 MG capsule Take 2 g by mouth daily.   loratadine 10 MG tablet Commonly known as:  CLARITIN Take 10 mg by mouth daily as needed.   Magnesium 250 MG Tabs Take 250 mg by mouth daily.   meloxicam 7.5 MG tablet Commonly known as:  MOBIC take 1 to 2 tablets by mouth once daily   metroNIDAZOLE 0.75 % cream Commonly known as:  METROCREAM Apply 1 application topically daily.   multivitamin capsule Take 1 capsule by mouth daily.   Tea Tree 100 % Oil Apply 1 application topically 2 (two) times daily.   UNABLE TO FIND Apply 1 application topically 2 (two) times daily. undecyclenic acid 25%   VITAMIN D (ERGOCALCIFEROL) PO Take by mouth. 1800 IU DAILY   VOLTAREN 1 % Gel Generic drug:  diclofenac sodium Apply 2 g topically daily as needed (pain).       Review of systems negative except as noted in HPI / PMHx or noted below:  Review of Systems  Constitutional: Negative.   HENT: Negative.   Eyes: Negative.   Respiratory: Negative.   Cardiovascular: Negative.   Gastrointestinal: Negative.   Genitourinary: Negative.   Musculoskeletal: Negative.   Skin: Negative.   Neurological: Negative.   Endo/Heme/Allergies: Negative.   Psychiatric/Behavioral: Negative.     Family History  Adopted: Yes  Problem Relation Age of Onset  . Asthma Father   . Pulmonary fibrosis Father   . Alzheimer's disease Maternal Grandfather   . Asthma Paternal Grandfather   . Stroke Paternal Grandfather   . Stroke Paternal  Grandmother   . Other Paternal Aunt        Brain bleed  . Heart failure Maternal Grandmother     Social History   Socioeconomic History  . Marital status: Married    Spouse name: Not on file  . Number of children: Not on file  . Years of education: Not on file  . Highest education level: Not on file  Occupational History  . Not on file  Social Needs  . Financial resource strain: Not on file  . Food insecurity:    Worry: Not on file    Inability: Not on file  . Transportation needs:    Medical: Not on file    Non-medical: Not on file  Tobacco Use  .  Smoking status: Former Smoker    Last attempt to quit: 10/10/1995    Years since quitting: 22.5  . Smokeless tobacco: Never Used  Substance and Sexual Activity  . Alcohol use: Yes    Alcohol/week: 6.0 oz    Types: 10 Standard drinks or equivalent per week  . Drug use: No  . Sexual activity: Yes    Birth control/protection: Condom    Comment: 1st intercourse 52 yo-More than 5 partners  Lifestyle  . Physical activity:    Days per week: Not on file    Minutes per session: Not on file  . Stress: Not on file  Relationships  . Social connections:    Talks on phone: Not on file    Gets together: Not on file    Attends religious service: Not on file    Active member of club or organization: Not on file    Attends meetings of clubs or organizations: Not on file    Relationship status: Not on file  . Intimate partner violence:    Fear of current or ex partner: Not on file    Emotionally abused: Not on file    Physically abused: Not on file    Forced sexual activity: Not on file  Other Topics Concern  . Not on file  Social History Narrative   Athletic, drives, completes ADLs.  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      Environmental and Social history  Lives in a house with a dry environment, no animals located inside the household, carpet in the bedroom, no plastic on the bed, no plastic on the pillow, and no smokers  located inside the household.  She works in an office setting.  Objective:   Vitals:   05/07/18 0834  BP: 108/62  Pulse: 68  Resp: 18  Temp: 98 F (36.7 C)  SpO2: 98%   Height: 5\' 4"  (162.6 cm) Weight: 124 lb (56.2 kg)  Physical Exam  HENT:  Head: Normocephalic. Head is without right periorbital erythema and without left periorbital erythema.  Right Ear: Tympanic membrane, external ear and ear canal normal.  Left Ear: Tympanic membrane, external ear and ear canal normal.  Nose: Nose normal. No mucosal edema or rhinorrhea.  Mouth/Throat: Uvula is midline, oropharynx is clear and moist and mucous membranes are normal. No oropharyngeal exudate.  Eyes: Pupils are equal, round, and reactive to light. Conjunctivae and lids are normal.  Neck: Trachea normal. No tracheal tenderness present. No tracheal deviation present. No thyromegaly present.  Cardiovascular: Normal rate, regular rhythm, S1 normal, S2 normal and normal heart sounds.  No murmur heard. Pulmonary/Chest: Effort normal and breath sounds normal. No stridor. No respiratory distress. She has no wheezes. She has no rales. She exhibits no tenderness.  Abdominal: Soft. She exhibits no distension and no mass. There is no hepatosplenomegaly. There is no tenderness. There is no rebound and no guarding.  Musculoskeletal: She exhibits no edema or tenderness.  Lymphadenopathy:       Head (right side): No tonsillar adenopathy present.       Head (left side): No tonsillar adenopathy present.    She has no cervical adenopathy.    She has no axillary adenopathy.  Neurological: She is alert.  Skin: No rash noted. She is not diaphoretic. No erythema. No pallor. Nails show no clubbing.    Diagnostics:   Allergy skin tests were performed.  She demonstrated hypersensitivity against grass, weeds, trees, dust mite, cat, and dog.  As well, she  demonstrated hypersensitivity against tree nuts including cashew, pecan, walnut, [place] not, and  pistachio and also demonstrated hypersensitivity against fish mix.  Should be noted that she eats fish commonly and has never had a problem with this exposure.  She also had very slight hypersensitivity against soy but eats tofu without any problem.  Spirometry was performed and demonstrated an FEV1 of 3.45 @ 124 % of predicted. FEV1/FVC = 0.95  Assessment and Plan:    1. Asthma, mild intermittent, well-controlled   2. Seasonal allergic rhinitis due to pollen   3. Seasonal allergic conjunctivitis   4. Ocular rosacea   5. Dry eye syndrome of both eyes   6. Anaphylactic shock due to food, initial encounter   7. Anaphylaxis due to hymenoptera venom, accidental or unintentional, initial encounter     1.  Allergen avoidance measures  2.  Attempt to avoid oral antihistamine use given dry eye syndrome  3.  Do not rub or touch eyes:  Use Systane eyedrops multiple times a day  4.  Treat and prevent inflammation:   A.  OTC Nasacort/Rhinocort 1 spray each nostril 3 times a week  B.  Montelukast 10 mg tablet 1 time per day  5.  If needed:   A.  Azelastine -1-2 sprays each nostril 1-2 times a day  B.  Auvi-Q 0.3, Benadryl, MD/ER evaluation for allergic reaction  C.  Proventil HFA or similar 2 inhalations every 4-6 hours  D.  Patanol - 1 drop each eye 2 times per day  6.  Continue topical ophthalmic cyclosporine and doxycycline for dry eye and ocular rosacea  7.  Consider a course of immunotherapy  8.  Return to clinic in 6 months or earlier if problem  Narissa is very atopic and it will be somewhat difficult for her to perform allergen avoidance measures against all of the allergens she is allergic to while living in [place].  I have given her some information about allergen avoidance measures and I have asked her to consistently use some dose of anti-inflammatory agents in the form of a nasal steroid and a leukotriene modifier.  She has a collection of other medications that she  can use if needed or if required as noted above.  She appears to have 3 distinct insults to her conjunctiva including allergic disease, dry eye syndrome, and ocular rosacea and hopefully with the plan noted above she can get this condition under better control.  She would definitely be a candidate for immunotherapy and I have given her literature on this form of treatment during today's visit.  We will see how things go over the course of the next several months.  Jessica Priest, MD Allergy / Immunology [facility] [place]

## 2018-05-08 ENCOUNTER — Encounter: Payer: Self-pay | Admitting: Allergy and Immunology

## 2018-05-24 DIAGNOSIS — H04123 Dry eye syndrome of bilateral lacrimal glands: Secondary | ICD-10-CM | POA: Diagnosis not present

## 2018-06-27 DIAGNOSIS — Z23 Encounter for immunization: Secondary | ICD-10-CM | POA: Diagnosis not present

## 2018-07-02 DIAGNOSIS — H04123 Dry eye syndrome of bilateral lacrimal glands: Secondary | ICD-10-CM | POA: Diagnosis not present

## 2018-07-04 DIAGNOSIS — L719 Rosacea, unspecified: Secondary | ICD-10-CM | POA: Diagnosis not present

## 2018-10-22 ENCOUNTER — Ambulatory Visit (INDEPENDENT_AMBULATORY_CARE_PROVIDER_SITE_OTHER): Payer: 59 | Admitting: Allergy and Immunology

## 2018-10-22 ENCOUNTER — Encounter: Payer: Self-pay | Admitting: Allergy and Immunology

## 2018-10-22 VITALS — BP 96/58 | HR 64 | Resp 16 | Ht 64.0 in | Wt 123.4 lb

## 2018-10-22 DIAGNOSIS — J301 Allergic rhinitis due to pollen: Secondary | ICD-10-CM

## 2018-10-22 DIAGNOSIS — H1010 Acute atopic conjunctivitis, unspecified eye: Secondary | ICD-10-CM | POA: Diagnosis not present

## 2018-10-22 DIAGNOSIS — T63441D Toxic effect of venom of bees, accidental (unintentional), subsequent encounter: Secondary | ICD-10-CM

## 2018-10-22 DIAGNOSIS — T7800XD Anaphylactic reaction due to unspecified food, subsequent encounter: Secondary | ICD-10-CM | POA: Diagnosis not present

## 2018-10-22 DIAGNOSIS — H04123 Dry eye syndrome of bilateral lacrimal glands: Secondary | ICD-10-CM

## 2018-10-22 DIAGNOSIS — J4520 Mild intermittent asthma, uncomplicated: Secondary | ICD-10-CM | POA: Diagnosis not present

## 2018-10-22 DIAGNOSIS — L718 Other rosacea: Secondary | ICD-10-CM

## 2018-10-22 MED ORDER — EPINEPHRINE 0.3 MG/0.3ML IJ SOAJ: 0.3000 mg | Freq: Once | INTRAMUSCULAR | 2 refills | Status: DC

## 2018-10-22 MED ORDER — MONTELUKAST SODIUM 10 MG PO TABS: 10.0000 mg | ORAL_TABLET | Freq: Every day | ORAL | 5 refills | Status: DC

## 2018-10-22 MED ORDER — EPINEPHRINE 0.3 MG/0.3ML IJ SOAJ: 0.3000 mg | Freq: Once | INTRAMUSCULAR | 2 refills | Status: AC

## 2018-10-22 NOTE — Patient Instructions (Addendum)
  1.  Avoid oral antihistamine use given dry eye syndrome  2.  Do not rub or touch eyes:  Use Systane eyedrops multiple times a day  4.  Continue to Treat and prevent inflammation:   A.  OTC Nasacort/Rhinocort 1 spray each nostril 3 times a week  B.  Montelukast 10 mg tablet 1 time per day  5.  If needed:   A.  Auvi-Q 0.3, Benadryl, MD/ER evaluation for allergic reaction  B.  Proventil HFA or similar 2 inhalations every 4-6 hours  6.  Continue doxycycline for dry eye and ocular rosacea  7.  Return to clinic if needed

## 2018-10-22 NOTE — Progress Notes (Signed)
Follow-up Note  Referring Provider: Gweneth Dimitri, MD Primary Provider: Gweneth Dimitri, MD Date of Office Visit: 10/22/2018  Subjective:   Katherine Wolfe (DOB: [birth year]) is a 53 y.o. female who [facility] in re-evaluation of the following:  HPI: Katherine Wolfe returns to this clinic in evaluation of allergic rhinoconjunctivitis, dry eye syndrome, ocular rosacea, food allergy predominantly directed against tree nuts, mild intermittent asthma, and a history of hymenoptera venom hypersensitivity state.  I last saw her in this clinic on 07 May 2018.  She is really doing much better regarding her eyes which was the major concern when she presented to this clinic.  She is using a combination of treatment which includes doxycycline and Systane eyedrops and avoidance of all oral antihistamines and montelukast on a consistent basis.  As well, she has had very little problems with her nose or her chest and rarely uses a short acting bronchodilator and can exercise without any difficulty.  She does have an EpiPen and avoids hymenoptera as best as possible and avoids tree nut consumption.  She did obtain the flu vaccine this year.  Allergies as of 10/22/2018      Reactions   Other Swelling   TREE NUTS; WASPS, HORNETS AND YELLOW JACKETS      Medication List      adapalene 0.1 % gel Commonly known as:  DIFFERIN Apply 1 application topically at bedtime.   ALAWAY CHILDRENS ALLERGY 0.025 % ophthalmic solution Generic drug:  ketotifen Place 1 drop into both eyes 2 (two) times daily.   CALCIUM + D PO Take 1 tablet by mouth daily.   co-enzyme Q-10 30 MG capsule Take 30 mg by mouth 3 (three) times daily.   cycloSPORINE 0.1 % Emul Apply 1 drop to eye 2 (two) times daily.   doxycycline 20 MG tablet Commonly known as:  PERIOSTAT Take 20 mg by mouth 2 (two) times daily.   EPINEPHrine 0.3 mg/0.3 mL Soaj injection Commonly known as:   AUVI-Q Inject 0.3 mLs (0.3 mg total) into the muscle once for 1 dose. As directed for life-threatening allergic reactions   fish oil-omega-3 fatty acids 1000 MG capsule Take 2 g by mouth daily.   Magnesium 250 MG Tabs Take 250 mg by mouth daily.   meloxicam 7.5 MG tablet Commonly known as:  MOBIC take 1 to 2 tablets by mouth once daily   metroNIDAZOLE 0.75 % cream Commonly known as:  METROCREAM Apply 1 application topically daily.   montelukast 10 MG tablet Commonly known as:  SINGULAIR Take 1 tablet (10 mg total) by mouth at bedtime.   multivitamin capsule Take 1 capsule by mouth daily.   olopatadine 0.1 % ophthalmic solution Commonly known as:  PATANOL Place 1 drop into both eyes 2 (two) times daily.   Tea Tree 100 % Oil Apply 1 application topically 2 (two) times daily.   UNABLE TO FIND Apply 1 application topically 2 (two) times daily. undecyclenic acid 25%   VITAMIN D (ERGOCALCIFEROL) PO Take by mouth. 1800 IU DAILY   VOLTAREN 1 % Gel Generic drug:  diclofenac sodium Apply 2 g topically daily as needed (pain).       Past Medical History:  Diagnosis Date  . Anorexia    in high school, none since  . Asthma    well controlled  . Cervical dysplasia    and shoulder pain  . Knee pain, bilateral 11-12   SEES DR. FIELDS  .  Migraine   . Rosacea     Past Surgical History:  Procedure Laterality Date  . DILATION AND CURETTAGE OF UTERUS    . GYNECOLOGIC CRYOSURGERY    . HYSTEROSCOPY  01/2008   D & C  . KNEE SURGERY     Arthroscopic  . PELVIC LAPAROSCOPY  09/1999   right ovarian fibroma  . RHINOPLASTY    . ROTATOR CUFF REPAIR      Review of systems negative except as noted in HPI / PMHx or noted below:  Review of Systems  Constitutional: Negative.   HENT: Negative.   Eyes: Negative.   Respiratory: Negative.   Cardiovascular: Negative.   Gastrointestinal: Negative.   Genitourinary: Negative.   Musculoskeletal: Negative.   Skin: Negative.    Neurological: Negative.   Endo/Heme/Allergies: Negative.   Psychiatric/Behavioral: Negative.      Objective:   Vitals:   10/22/18 0955  BP: (!) 96/58  Pulse: 64  Resp: 16  SpO2: 97%   Height: 5\' 4"  (162.6 cm)  Weight: 123 lb 6.4 oz (56 kg)   Physical Exam Constitutional:      Appearance: She is not diaphoretic.  HENT:     Head: Normocephalic.     Right Ear: Tympanic membrane, ear canal and external ear normal.     Left Ear: Tympanic membrane, ear canal and external ear normal.     Nose: Nose normal. No mucosal edema or rhinorrhea.     Mouth/Throat:     Pharynx: Uvula midline. No oropharyngeal exudate.  Eyes:     Conjunctiva/sclera: Conjunctivae normal.  Neck:     Thyroid: No thyromegaly.     Trachea: Trachea normal. No tracheal tenderness or tracheal deviation.  Cardiovascular:     Rate and Rhythm: Normal rate and regular rhythm.     Heart sounds: Normal heart sounds, S1 normal and S2 normal. No murmur.  Pulmonary:     Effort: No respiratory distress.     Breath sounds: Normal breath sounds. No stridor. No wheezing or rales.  Lymphadenopathy:     Head:     Right side of head: No tonsillar adenopathy.     Left side of head: No tonsillar adenopathy.     Cervical: No cervical adenopathy.  Skin:    Findings: No erythema or rash.     Nails: There is no clubbing.   Neurological:     Mental Status: She is alert.     Diagnostics:    Spirometry was performed and demonstrated an FEV1 of 3.15 at 115 % of predicted.   Assessment and Plan:   1. Asthma, mild intermittent, well-controlled   2. Seasonal allergic rhinitis due to pollen   3. Seasonal allergic conjunctivitis   4. Ocular rosacea   5. Dry eye syndrome of both eyes   6. Anaphylactic shock due to food, subsequent encounter   7. Systemic reaction to bee sting, accidental or unintentional, subsequent encounter     1.  Avoid oral antihistamine use given dry eye syndrome  2.  Do not rub or touch eyes:   Use Systane eyedrops multiple times a day  4.  Continue to Treat and prevent inflammation:   A.  OTC Nasacort/Rhinocort 1 spray each nostril 3 times a week  B.  Montelukast 10 mg tablet 1 time per day  5.  If needed:   A.  Auvi-Q 0.3, Benadryl, MD/ER evaluation for allergic reaction  B.  Proventil HFA or similar 2 inhalations every 4-6 hours  6.  Continue  doxycycline for dry eye and ocular rosacea  7.  Return to clinic if needed   At this point in time Katherine Wolfe is doing quite well regarding all of her issues and she will continue to use anti-inflammatory agents with a nasal steroid and a leukotriene modifier as noted above and continue on other therapy for her ocular rosacea and if needed a short acting bronchodilator for her asthma and if needed an EpiPen should she develop a systemic reaction in the future.  At this point she has elected not to undergo immunotherapy for either hymenoptera venom hypersensitivity state or her aero allergen hypersensitivity.  She has done so well that we have refilled her medications for 1 year and she is welcome to return to this clinic should she develop a significant problem in the future.  Otherwise, she can have her medications refilled by her primary care doctor.  Laurette SchimkeEric , MD Allergy / Immunology [facility]

## 2018-10-23 ENCOUNTER — Encounter: Payer: Self-pay | Admitting: Allergy and Immunology

## 2018-11-08 ENCOUNTER — Other Ambulatory Visit: Payer: Self-pay | Admitting: Allergy & Immunology

## 2018-11-14 ENCOUNTER — Encounter: Payer: 59 | Admitting: Gynecology

## 2018-11-26 ENCOUNTER — Encounter: Payer: 59 | Admitting: Gynecology

## 2018-11-28 ENCOUNTER — Ambulatory Visit (INDEPENDENT_AMBULATORY_CARE_PROVIDER_SITE_OTHER): Payer: 59 | Admitting: Gynecology

## 2018-11-28 ENCOUNTER — Encounter: Payer: Self-pay | Admitting: Gynecology

## 2018-11-28 VITALS — BP 118/74 | Ht 64.0 in | Wt 126.0 lb

## 2018-11-28 DIAGNOSIS — Z1159 Encounter for screening for other viral diseases: Secondary | ICD-10-CM | POA: Diagnosis not present

## 2018-11-28 DIAGNOSIS — Z01419 Encounter for gynecological examination (general) (routine) without abnormal findings: Secondary | ICD-10-CM

## 2018-11-28 DIAGNOSIS — N3000 Acute cystitis without hematuria: Secondary | ICD-10-CM | POA: Diagnosis not present

## 2018-11-28 DIAGNOSIS — N951 Menopausal and female climacteric states: Secondary | ICD-10-CM

## 2018-11-28 DIAGNOSIS — Z1151 Encounter for screening for human papillomavirus (HPV): Secondary | ICD-10-CM

## 2018-11-28 DIAGNOSIS — R69 Illness, unspecified: Secondary | ICD-10-CM | POA: Diagnosis not present

## 2018-11-28 NOTE — Progress Notes (Signed)
    Katherine Wolfe 07/01/1966 [id number]        53 y.o.  G0P0 for annual gynecologic exam.  Patient has been over 1 year without menses.  She is having hot flushes and sweats.  Also notes over the last several days some odor to her urine and mild dysuria.  No fever or chills.  No significant lower abdominal discomfort.  No vaginal discharge or irritation  Past medical history,surgical history, problem list, medications, allergies, family history and social history were all reviewed and documented as reviewed in the EPIC chart.  ROS:  Performed with pertinent positives and negatives included in the history, assessment and plan.   Additional significant findings : None   Exam: Kennon Portela assistant Vitals:   11/28/18 0825  BP: 118/74  Weight: 126 lb (57.2 kg)  Height: 5\' 4"  (1.626 m)   Body mass index is 21.63 kg/m.  General appearance:  Normal affect, orientation and appearance. Skin: Grossly normal HEENT: Without gross lesions.  No cervical or supraclavicular adenopathy. Thyroid normal.  Lungs:  Clear without wheezing, rales or rhonchi Cardiac: RR, without RMG Abdominal:  Soft, nontender, without masses, guarding, rebound, organomegaly or hernia Breasts:  Examined lying and sitting without masses, retractions, discharge or axillary adenopathy. Pelvic:  Ext, BUS, Vagina: Normal  Cervix: Normal.  Pap smear/HPV  Uterus: Anteverted, normal size, shape and contour, midline and mobile nontender   Adnexa: Without masses or tenderness    Anus and perineum: Normal   Rectovaginal: Normal sphincter tone without palpated masses or tenderness.    Assessment/Plan:  53 y.o. G0P0 female for annual gynecologic exam.   1. Postmenopausal.  Over a year without menses.  Having some hot flushes and sweats.  Options for management to include observation, OTC products and prescription medications to include HRT discussed.  At this point the patient's not interested in any prescriptions.  Will  monitor for now.  Will follow-up if she wants to rediscuss HRT.  Will call if any bleeding. 2. UTI symptoms.  Urine analysis is clear.  Recommended push fluids for now.  If symptoms persist she will call and I will treat her with a short course of antibiotics based on symptoms but at this point we will see if they do not spontaneously resolve with hydration. 3. Mammography 03/2018.  Continue with annual mammography when due.  Breast exam normal today. 4. Pap smear/HPV 2015.  Pap smear/HPV today.  History of cryosurgery number of years ago with normal Pap smears since. 5. Colonoscopy 2017.  Repeat at their recommended interval. 6. Health maintenance.  No routine lab work done as patient reports this done elsewhere.  Patient did request hepatitis C screening which was ordered.  Follow-up 1 year, sooner as needed   [name], 8:51 AM 11/28/2018

## 2018-11-28 NOTE — Patient Instructions (Signed)
Call if your urinary symptoms continue

## 2018-11-28 NOTE — Addendum Note (Signed)
Addended by: Dayna Barker on: 11/28/2018 09:03 AM   Modules accepted: Orders

## 2018-11-28 NOTE — Addendum Note (Signed)
Addended by: Dayna Barker on: 11/28/2018 09:06 AM   Modules accepted: Orders

## 2018-11-29 ENCOUNTER — Encounter: Payer: Self-pay | Admitting: Gynecology

## 2018-11-29 LAB — PAP IG AND HPV HIGH-RISK: HPV DNA High Risk: NOT DETECTED

## 2018-11-29 LAB — URINALYSIS, COMPLETE W/RFL CULTURE
Bacteria, UA: NONE SEEN /HPF
Bilirubin Urine: NEGATIVE
Glucose, UA: NEGATIVE
Hgb urine dipstick: NEGATIVE
Hyaline Cast: NONE SEEN /LPF
KETONES UR: NEGATIVE
Leukocyte Esterase: NEGATIVE
Nitrites, Initial: NEGATIVE
Protein, ur: NEGATIVE
RBC / HPF: NONE SEEN /HPF (ref 0–2)
Specific Gravity, Urine: 1.015 (ref 1.001–1.03)
WBC, UA: NONE SEEN /HPF (ref 0–5)
pH: 7 (ref 5.0–8.0)

## 2018-11-29 LAB — HEPATITIS C ANTIBODY
Hepatitis C Ab: NONREACTIVE
SIGNAL TO CUT-OFF: 0.02 (ref <1.00)

## 2018-11-29 LAB — NO CULTURE INDICATED

## 2018-12-03 ENCOUNTER — Ambulatory Visit (INDEPENDENT_AMBULATORY_CARE_PROVIDER_SITE_OTHER): Payer: 59 | Admitting: Sports Medicine

## 2018-12-03 ENCOUNTER — Encounter: Payer: Self-pay | Admitting: Sports Medicine

## 2018-12-03 VITALS — BP 96/64 | Ht 64.0 in | Wt 121.0 lb

## 2018-12-03 DIAGNOSIS — M2480 Other specific joint derangements of unspecified joint, not elsewhere classified: Secondary | ICD-10-CM | POA: Diagnosis not present

## 2018-12-03 DIAGNOSIS — M7632 Iliotibial band syndrome, left leg: Secondary | ICD-10-CM | POA: Diagnosis not present

## 2018-12-03 NOTE — Progress Notes (Signed)
   HPI  CC: Left knee pain  Katherine Wolfe is a 53 year old female who presents for left knee pain.  She has a diagnosis of hypermobility disorder.  She states the knee pain started when she was running several weeks ago.  She states that the pain was made worse when she did any leg extensions as well.  She stopped doing the leg extensions and stopped running, is had some improvement of the pain.  Now she has some pain over Gertie's tubercle on the left knee.  She states that is doing doing better with rest.  She has been taken meloxicam as needed with some improvement.  He denies any numbness or tingling down her leg.  She denies any weakness of the leg.  She states that her other joints been doing well since his last seen.  She has been doing Pilates for hypermobility patients.  See HPI and/or previous note for associated ROS.  Objective: BP 96/64   Ht 5\' 4"  (1.626 m)   Wt 121 lb (54.9 kg)   LMP 10/31/2017   BMI 20.77 kg/m  Gen: Right-Hand Dominant. NAD, well groomed, a/o x3, normal affect.  CV: Well-perfused. Warm.  Resp: Non-labored.  Neuro: Sensation intact throughout. No gross coordination deficits.  Gait: Nonpathologic posture, gait significant for left forefoot valgus during jog.  Left lower extremity exam: No erythema, warmth, swelling noted.  Tenderness palpation over Gertie's tubercle.  Full range of motion in extension of the knee.  Range of motion beyond normal limits in flexion.  Strength 5 5 throughout testing of the knee.  Hip abductor strength 4+ out of 5.  Negative logroll test.  Negative straight leg raise bilaterally.  Negative Lockman, negative posterior drawer, negative valgus stress test, negative varus stress test.  Negative McMurray test.  Assessment and plan: 1.  Generalized hypermobility disorder 2.  IT band syndrome, left leg  We discussed treatment options at today's visit.  For IT band syndrome, we will give her some stretches and some hip abductor exercises to  work on.  She is doing well from the hypermobility standpoint.  We would like to note that since she was originally diagnosed with hypermobility several years ago, the criteria for diagnosis has changed.  During the time of her original diagnosis she was considered EDS type III.  This is since changed to the diagnosis of generalized hypermobility disorder.  This diagnosis defers hypermobility of joints, without autonomic or pots symptoms.  This is associated with normal outcomes typically, and is associated with a normal life expectancy.  We will see her follow-up as needed.   Alric Quan, MD [facility] Sports Medicine Fellow 12/03/2018 1:58 PM  I observed and examined the patient with the [facility] Fellow and agree with assessment and plan.  Note reviewed and modified by me. Sterling Big, MD

## 2018-12-09 DIAGNOSIS — T7805XD Anaphylactic reaction due to tree nuts and seeds, subsequent encounter: Secondary | ICD-10-CM | POA: Diagnosis not present

## 2018-12-09 DIAGNOSIS — Z0001 Encounter for general adult medical examination with abnormal findings: Secondary | ICD-10-CM | POA: Diagnosis not present

## 2018-12-09 DIAGNOSIS — R21 Rash and other nonspecific skin eruption: Secondary | ICD-10-CM | POA: Diagnosis not present

## 2018-12-09 DIAGNOSIS — Z136 Encounter for screening for cardiovascular disorders: Secondary | ICD-10-CM | POA: Diagnosis not present

## 2018-12-09 DIAGNOSIS — R5383 Other fatigue: Secondary | ICD-10-CM | POA: Diagnosis not present

## 2018-12-09 DIAGNOSIS — Z131 Encounter for screening for diabetes mellitus: Secondary | ICD-10-CM | POA: Diagnosis not present

## 2018-12-09 DIAGNOSIS — J4520 Mild intermittent asthma, uncomplicated: Secondary | ICD-10-CM | POA: Diagnosis not present

## 2018-12-09 DIAGNOSIS — L719 Rosacea, unspecified: Secondary | ICD-10-CM | POA: Diagnosis not present

## 2018-12-09 DIAGNOSIS — Z23 Encounter for immunization: Secondary | ICD-10-CM | POA: Diagnosis not present

## 2018-12-09 DIAGNOSIS — E559 Vitamin D deficiency, unspecified: Secondary | ICD-10-CM | POA: Diagnosis not present

## 2018-12-30 DIAGNOSIS — J4531 Mild persistent asthma with (acute) exacerbation: Secondary | ICD-10-CM | POA: Diagnosis not present

## 2019-03-18 DIAGNOSIS — L719 Rosacea, unspecified: Secondary | ICD-10-CM | POA: Diagnosis not present

## 2019-03-18 DIAGNOSIS — Z86018 Personal history of other benign neoplasm: Secondary | ICD-10-CM | POA: Diagnosis not present

## 2019-03-18 DIAGNOSIS — L309 Dermatitis, unspecified: Secondary | ICD-10-CM | POA: Diagnosis not present

## 2019-03-18 DIAGNOSIS — B009 Herpesviral infection, unspecified: Secondary | ICD-10-CM | POA: Diagnosis not present

## 2019-03-18 DIAGNOSIS — D225 Melanocytic nevi of trunk: Secondary | ICD-10-CM | POA: Diagnosis not present

## 2019-03-18 DIAGNOSIS — L821 Other seborrheic keratosis: Secondary | ICD-10-CM | POA: Diagnosis not present

## 2019-04-18 ENCOUNTER — Encounter: Payer: Self-pay | Admitting: Gynecology

## 2019-04-18 DIAGNOSIS — Z1231 Encounter for screening mammogram for malignant neoplasm of breast: Secondary | ICD-10-CM | POA: Diagnosis not present

## 2019-05-01 ENCOUNTER — Other Ambulatory Visit: Payer: Self-pay | Admitting: *Deleted

## 2019-05-01 MED ORDER — MONTELUKAST SODIUM 10 MG PO TABS: 10.0000 mg | ORAL_TABLET | Freq: Every day | ORAL | 0 refills | Status: DC

## 2019-05-14 DIAGNOSIS — J029 Acute pharyngitis, unspecified: Secondary | ICD-10-CM | POA: Diagnosis not present

## 2019-05-14 DIAGNOSIS — J4531 Mild persistent asthma with (acute) exacerbation: Secondary | ICD-10-CM | POA: Diagnosis not present

## 2019-05-14 DIAGNOSIS — R6883 Chills (without fever): Secondary | ICD-10-CM | POA: Diagnosis not present

## 2019-05-14 DIAGNOSIS — G44209 Tension-type headache, unspecified, not intractable: Secondary | ICD-10-CM | POA: Diagnosis not present

## 2019-05-19 DIAGNOSIS — J029 Acute pharyngitis, unspecified: Secondary | ICD-10-CM | POA: Diagnosis not present

## 2019-05-29 ENCOUNTER — Other Ambulatory Visit: Payer: Self-pay | Admitting: Allergy and Immunology

## 2019-06-08 ENCOUNTER — Other Ambulatory Visit: Payer: Self-pay | Admitting: Allergy and Immunology

## 2019-06-18 DIAGNOSIS — L309 Dermatitis, unspecified: Secondary | ICD-10-CM | POA: Diagnosis not present

## 2019-06-18 DIAGNOSIS — L719 Rosacea, unspecified: Secondary | ICD-10-CM | POA: Diagnosis not present

## 2019-06-18 DIAGNOSIS — B009 Herpesviral infection, unspecified: Secondary | ICD-10-CM | POA: Diagnosis not present

## 2019-06-22 DIAGNOSIS — S90112A Contusion of left great toe without damage to nail, initial encounter: Secondary | ICD-10-CM | POA: Diagnosis not present

## 2019-06-30 DIAGNOSIS — H25013 Cortical age-related cataract, bilateral: Secondary | ICD-10-CM | POA: Diagnosis not present

## 2019-06-30 DIAGNOSIS — H04123 Dry eye syndrome of bilateral lacrimal glands: Secondary | ICD-10-CM | POA: Diagnosis not present

## 2019-06-30 DIAGNOSIS — H524 Presbyopia: Secondary | ICD-10-CM | POA: Diagnosis not present

## 2019-07-02 ENCOUNTER — Encounter: Payer: Self-pay | Admitting: Gynecology

## 2019-07-07 DIAGNOSIS — S90112D Contusion of left great toe without damage to nail, subsequent encounter: Secondary | ICD-10-CM | POA: Diagnosis not present

## 2019-07-18 DIAGNOSIS — Z23 Encounter for immunization: Secondary | ICD-10-CM | POA: Diagnosis not present

## 2019-09-03 DIAGNOSIS — Z20828 Contact with and (suspected) exposure to other viral communicable diseases: Secondary | ICD-10-CM | POA: Diagnosis not present

## 2019-09-16 DIAGNOSIS — Z03818 Encounter for observation for suspected exposure to other biological agents ruled out: Secondary | ICD-10-CM | POA: Diagnosis not present

## 2019-09-19 DIAGNOSIS — Z20828 Contact with and (suspected) exposure to other viral communicable diseases: Secondary | ICD-10-CM | POA: Diagnosis not present

## 2019-11-17 ENCOUNTER — Other Ambulatory Visit: Payer: Self-pay | Admitting: *Deleted

## 2019-11-17 MED ORDER — MONTELUKAST SODIUM 10 MG PO TABS: 10.0000 mg | ORAL_TABLET | Freq: Every day | ORAL | 0 refills | Status: AC

## 2019-11-23 ENCOUNTER — Ambulatory Visit: Payer: 59 | Attending: Internal Medicine

## 2019-11-23 DIAGNOSIS — Z23 Encounter for immunization: Secondary | ICD-10-CM | POA: Insufficient documentation

## 2019-11-23 NOTE — Progress Notes (Signed)
   Covid-19 Vaccination Clinic  Name:  Katherine Wolfe    MRN: [id number] DOB: [birth year]  11/23/2019  [name] was observed post Covid-19 immunization for 30 minutes based on pre-vaccination screening without incidence. She was provided with Vaccine Information Sheet and instruction to access the V-Safe system.   [name] was instructed to call 911 with any severe reactions post vaccine: Marland Kitchen Difficulty breathing  . Swelling of your face and throat  . A fast heartbeat  . A bad rash all over your body  . Dizziness and weakness    Immunizations Administered    Name Date Dose VIS Date Route   Pfizer COVID-19 Vaccine 11/23/2019  8:15 AM 0.3 mL 09/19/2019 Intramuscular   Manufacturer: [employer], [employer]   Lot: [id number]   NDC: [address]-3

## 2019-12-02 DIAGNOSIS — Z20828 Contact with and (suspected) exposure to other viral communicable diseases: Secondary | ICD-10-CM | POA: Diagnosis not present

## 2019-12-15 ENCOUNTER — Other Ambulatory Visit: Payer: Self-pay | Admitting: Allergy and Immunology

## 2019-12-16 ENCOUNTER — Ambulatory Visit: Payer: 59 | Attending: Internal Medicine

## 2019-12-16 DIAGNOSIS — Z23 Encounter for immunization: Secondary | ICD-10-CM | POA: Insufficient documentation

## 2019-12-16 NOTE — Progress Notes (Signed)
   Covid-19 Vaccination Clinic  Name:  Katherine Wolfe    MRN: [id number] DOB: 1966-09-26  12/16/2019  [name] was observed post Covid-19 immunization for 30 minutes based on pre-vaccination screening without incident. She was provided with Vaccine Information Sheet and instruction to access the V-Safe system.   [name] was instructed to call 911 with any severe reactions post vaccine: Marland Kitchen Difficulty breathing  . Swelling of face and throat  . A fast heartbeat  . A bad rash all over body  . Dizziness and weakness   Immunizations Administered    Name Date Dose VIS Date Route   Pfizer COVID-19 Vaccine 12/16/2019  8:11 AM 0.3 mL 09/19/2019 Intramuscular   Manufacturer: [employer], [employer]   Lot: [id number]   NDC: [address]-2

## 2019-12-29 DIAGNOSIS — J309 Allergic rhinitis, unspecified: Secondary | ICD-10-CM | POA: Diagnosis not present

## 2019-12-29 DIAGNOSIS — L719 Rosacea, unspecified: Secondary | ICD-10-CM | POA: Diagnosis not present

## 2019-12-29 DIAGNOSIS — Z0000 Encounter for general adult medical examination without abnormal findings: Secondary | ICD-10-CM | POA: Diagnosis not present

## 2019-12-29 DIAGNOSIS — H1013 Acute atopic conjunctivitis, bilateral: Secondary | ICD-10-CM | POA: Diagnosis not present

## 2019-12-29 DIAGNOSIS — J4530 Mild persistent asthma, uncomplicated: Secondary | ICD-10-CM | POA: Diagnosis not present

## 2019-12-29 DIAGNOSIS — E559 Vitamin D deficiency, unspecified: Secondary | ICD-10-CM | POA: Diagnosis not present

## 2019-12-29 DIAGNOSIS — T7805XD Anaphylactic reaction due to tree nuts and seeds, subsequent encounter: Secondary | ICD-10-CM | POA: Diagnosis not present

## 2019-12-29 DIAGNOSIS — M85851 Other specified disorders of bone density and structure, right thigh: Secondary | ICD-10-CM | POA: Diagnosis not present

## 2019-12-29 DIAGNOSIS — M25552 Pain in left hip: Secondary | ICD-10-CM | POA: Diagnosis not present

## 2019-12-29 DIAGNOSIS — R8290 Unspecified abnormal findings in urine: Secondary | ICD-10-CM | POA: Diagnosis not present

## 2020-01-05 DIAGNOSIS — M545 Low back pain: Secondary | ICD-10-CM | POA: Diagnosis not present

## 2020-01-05 DIAGNOSIS — M25552 Pain in left hip: Secondary | ICD-10-CM | POA: Diagnosis not present

## 2020-02-16 DIAGNOSIS — M25552 Pain in left hip: Secondary | ICD-10-CM | POA: Diagnosis not present

## 2020-02-16 DIAGNOSIS — S90112D Contusion of left great toe without damage to nail, subsequent encounter: Secondary | ICD-10-CM | POA: Diagnosis not present

## 2020-03-19 DIAGNOSIS — L821 Other seborrheic keratosis: Secondary | ICD-10-CM | POA: Diagnosis not present

## 2020-03-19 DIAGNOSIS — D225 Melanocytic nevi of trunk: Secondary | ICD-10-CM | POA: Diagnosis not present

## 2020-03-19 DIAGNOSIS — Z86018 Personal history of other benign neoplasm: Secondary | ICD-10-CM | POA: Diagnosis not present

## 2020-03-19 DIAGNOSIS — C44319 Basal cell carcinoma of skin of other parts of face: Secondary | ICD-10-CM | POA: Diagnosis not present

## 2020-03-19 DIAGNOSIS — A6920 Lyme disease, unspecified: Secondary | ICD-10-CM | POA: Diagnosis not present

## 2020-03-19 DIAGNOSIS — L309 Dermatitis, unspecified: Secondary | ICD-10-CM | POA: Diagnosis not present

## 2020-03-19 DIAGNOSIS — D2261 Melanocytic nevi of right upper limb, including shoulder: Secondary | ICD-10-CM | POA: Diagnosis not present

## 2020-03-19 DIAGNOSIS — L719 Rosacea, unspecified: Secondary | ICD-10-CM | POA: Diagnosis not present

## 2020-03-19 DIAGNOSIS — D485 Neoplasm of uncertain behavior of skin: Secondary | ICD-10-CM | POA: Diagnosis not present

## 2020-03-19 DIAGNOSIS — L578 Other skin changes due to chronic exposure to nonionizing radiation: Secondary | ICD-10-CM | POA: Diagnosis not present

## 2020-04-23 DIAGNOSIS — Z1231 Encounter for screening mammogram for malignant neoplasm of breast: Secondary | ICD-10-CM | POA: Diagnosis not present

## 2020-04-30 DIAGNOSIS — C44319 Basal cell carcinoma of skin of other parts of face: Secondary | ICD-10-CM | POA: Diagnosis not present

## 2020-07-03 DIAGNOSIS — Z23 Encounter for immunization: Secondary | ICD-10-CM | POA: Diagnosis not present

## 2020-07-06 DIAGNOSIS — H04123 Dry eye syndrome of bilateral lacrimal glands: Secondary | ICD-10-CM | POA: Diagnosis not present

## 2020-07-06 DIAGNOSIS — H25012 Cortical age-related cataract, left eye: Secondary | ICD-10-CM | POA: Diagnosis not present

## 2020-07-06 DIAGNOSIS — H5213 Myopia, bilateral: Secondary | ICD-10-CM | POA: Diagnosis not present

## 2020-08-10 DIAGNOSIS — H04123 Dry eye syndrome of bilateral lacrimal glands: Secondary | ICD-10-CM | POA: Diagnosis not present

## 2020-08-31 DIAGNOSIS — D225 Melanocytic nevi of trunk: Secondary | ICD-10-CM | POA: Diagnosis not present

## 2020-08-31 DIAGNOSIS — D485 Neoplasm of uncertain behavior of skin: Secondary | ICD-10-CM | POA: Diagnosis not present

## 2020-09-27 DIAGNOSIS — Z20822 Contact with and (suspected) exposure to covid-19: Secondary | ICD-10-CM | POA: Diagnosis not present

## 2020-09-27 DIAGNOSIS — R059 Cough, unspecified: Secondary | ICD-10-CM | POA: Diagnosis not present

## 2020-09-28 DIAGNOSIS — Z20822 Contact with and (suspected) exposure to covid-19: Secondary | ICD-10-CM | POA: Diagnosis not present

## 2020-10-07 DIAGNOSIS — Z20822 Contact with and (suspected) exposure to covid-19: Secondary | ICD-10-CM | POA: Diagnosis not present

## 2020-10-10 DIAGNOSIS — Z03818 Encounter for observation for suspected exposure to other biological agents ruled out: Secondary | ICD-10-CM | POA: Diagnosis not present

## 2021-01-04 DIAGNOSIS — Z0000 Encounter for general adult medical examination without abnormal findings: Secondary | ICD-10-CM | POA: Diagnosis not present

## 2021-01-04 DIAGNOSIS — J4530 Mild persistent asthma, uncomplicated: Secondary | ICD-10-CM | POA: Diagnosis not present

## 2021-01-04 DIAGNOSIS — M25552 Pain in left hip: Secondary | ICD-10-CM | POA: Diagnosis not present

## 2021-01-04 DIAGNOSIS — T7805XD Anaphylactic reaction due to tree nuts and seeds, subsequent encounter: Secondary | ICD-10-CM | POA: Diagnosis not present

## 2021-01-04 DIAGNOSIS — L719 Rosacea, unspecified: Secondary | ICD-10-CM | POA: Diagnosis not present

## 2021-01-04 DIAGNOSIS — H1013 Acute atopic conjunctivitis, bilateral: Secondary | ICD-10-CM | POA: Diagnosis not present

## 2021-01-04 DIAGNOSIS — Z23 Encounter for immunization: Secondary | ICD-10-CM | POA: Diagnosis not present

## 2021-01-04 DIAGNOSIS — Z13220 Encounter for screening for lipoid disorders: Secondary | ICD-10-CM | POA: Diagnosis not present

## 2021-01-04 DIAGNOSIS — M85851 Other specified disorders of bone density and structure, right thigh: Secondary | ICD-10-CM | POA: Diagnosis not present

## 2021-01-04 DIAGNOSIS — J309 Allergic rhinitis, unspecified: Secondary | ICD-10-CM | POA: Diagnosis not present

## 2021-07-21 ENCOUNTER — Ambulatory Visit (INDEPENDENT_AMBULATORY_CARE_PROVIDER_SITE_OTHER): Payer: Managed Care, Other (non HMO) | Admitting: Sports Medicine

## 2021-07-21 DIAGNOSIS — M25512 Pain in left shoulder: Secondary | ICD-10-CM

## 2021-07-21 DIAGNOSIS — G8929 Other chronic pain: Secondary | ICD-10-CM | POA: Diagnosis not present

## 2021-07-22 NOTE — Progress Notes (Signed)
PCP: Gweneth Dimitri, MD  Subjective:   HPI: Patient is a 55 y.o. female living with Ehlers-Danlos syndrome here for left hip and left shoulder pain.  Patient has a history of right shoulder replacement.  Her left shoulder has recently been hurting after a COVID injection.  She is not had any dislocation to her knowledge. Pain is located anterior and posterior of shoulder.  In addition she is having pain in her left trapezius muscle, specifically from her cervical spine down to her left shoulder. She has a history of left hip pain which has been examined by Dr. Darrick Penna.  Dr. Darrick Penna notes this is labral pathology. She continues to have this pain and wondering if she should have surgical intervention.    BP (!) 90/58   Ht 5\' 4"  (1.626 m)   Wt 123 lb 12.8 oz (56.2 kg)   LMP 10/31/2017   BMI 21.25 kg/m   [facility] 07/21/2021  Frequency of aerobic exercise (# of days/week) 4  Average time in minutes 45  Frequency of strengthening activities (# of days/week) 4        Objective:  Physical Exam:  Gen: NAD, comfortable in exam room On inspections Left trapezius muscle tense ,left shoulder more anterior  No bruising. No swelling.  Full ROM in flexion, abduction, internal/external rotation NV intact distally Positive sulcus sign. Laxity of left shoulder Special Tests:  - Impingement: Neg Hawkins and Neers.  - Supraspinatus: Negative empty can.  5/5 strength - Infraspinatus/Teres: 5/5 strength with ER - Subscapularis: negative belly press, negative bear hug. 5/5 strength with IR - Biceps tendon: Negative Speeds.  - Labrum: Negative Obriens.  - AC Joint: Negative cross arm - Positive apprehension test  Left Hip: FROM, 5/5 motor    Assessment & Plan:  1.Left shoulder and neck pain: Patients pain began with vaccination. Expect local inflammation from injection and pain caused patient to place shoulder in anterior position. She has now strained trapezius  muscle and having a intermediate cervical radiculopathy. Patient will work with 07/23/2021 to strengthen and stabilize shoulder.  2. Left Hip: Continue Pilates. Discussed she may find relief of pain with arthroscopic surgery, but this could lead to the joint being more unstable given EDS. Recommendation is to continue Pilates and  conservative therapy.   I observed and examined the patient with the resident and agree with assessment and plan.  Note reviewed and modified by me. Gaffer, MD

## 2021-09-06 ENCOUNTER — Ambulatory Visit: Payer: Self-pay

## 2021-09-06 ENCOUNTER — Ambulatory Visit (INDEPENDENT_AMBULATORY_CARE_PROVIDER_SITE_OTHER): Payer: Managed Care, Other (non HMO) | Admitting: Sports Medicine

## 2021-09-06 VITALS — BP 110/78 | Ht 64.0 in | Wt 127.0 lb

## 2021-09-06 DIAGNOSIS — M25512 Pain in left shoulder: Secondary | ICD-10-CM

## 2021-09-06 DIAGNOSIS — G8929 Other chronic pain: Secondary | ICD-10-CM | POA: Diagnosis not present

## 2021-09-06 MED ORDER — METHYLPREDNISOLONE ACETATE 40 MG/ML IJ SUSP
40.0000 mg | Freq: Once | INTRAMUSCULAR | Status: AC
  Administered 2021-09-06: 40 mg via INTRA_ARTICULAR

## 2021-09-06 NOTE — Progress Notes (Signed)
Katherine Wolfe - 55 y.o. female MRN [id number]  Date of birth: [birth year]  SUBJECTIVE:   CC: L shoulder pain, L neck pain, and L hip pain   Katherine Wolfe is a 55 year old Caucasian female with past medical history of Ehlers-Danlos syndrome hypermobility subtype, right biceps tendon repair, left IT band syndrome, spectated labral injury left hip, who presents to clinic for persistent left shoulder, left neck and left hip pain.  Patient has had left shoulder and neck pain for several months.  Has previously been told she has a frayed bicep tendon per MRI imaging, received corticosteroid shot which improved her pain.  Her shoulder pain was exacerbated by COVID shot per OV Oct 2022.  She was unable to complete shoulder exercises recommended at last OV, though has been doing similar scapular exercises given to her by her Pilates teacher.  Her pain has improved with these exercises, however last week patient attempted more intense upper extremity exercises which she tolerated well day of, however the next day had exacerbation of left shoulder pain.  Patient intermittently takes ibuprofen and uses Voltaren gel daily which works well for her.  Patient experiences a clicking sensation in shoulder, has a dull ache along her neck, posterior shoulder, anterior shoulder and proximal humerus.  She intermittently has numbness down her arm.  Pain does not affect her day-to-day activities however are exacerbated by exercise and exercising is an important part of her life currently.  Patient has continued to have left hip pain every day, worse with exercise, worse while lying in bed on the affected side.  Back in early 2021 patient endorses getting which she reports looked good, and had a cortisone shot which improved her pain at that time.  Since then she has had steadily worsening pain which is now constant all associated with pain in her knee.  Of the 2 joints, her shoulder pain is more bothersome.   ROS:  Negative aside from above.  PHYSICAL EXAM:  VS: BP:110/78  HR: bpm  TEMP: ( )  RESP:   HT:5\' 4"  (162.6 cm)   WT:127 lb (57.6 kg)  BMI:21.79 PHYSICAL EXAM: Gen: NAD, alert, cooperative with exam, well-appearing HEENT: clear conjunctiva, nontraumatic CV:  no peripheral edema, extremities well perfused Resp: non-labored breathing on room air Skin: no rashes, normal turgor  Neuro: no gross deficits  Psych:  alert and oriented MSK:  Neck: Full ROM neck, with pain with head rotation to the affected side.  No tenderness to palpation cervical processes.  Tenderness to palpation bilateral trapezius muscle with muscular strain noted left >right. Negative Spurling's test. Left shoulder: Improved muscle bulk from prior.  Less prominent sulcus sign on the left shoulder.  Full strength 5/5.  Positive speeds and Yergason's test.  Negative Neer's, Leanord Asal, empty can.  Positive O'Brien's test.  Left hip: Full hip range of motion.  FABER test negative.  FADIR test positive.  Right hip abduction full strength.  Left hip abduction mild weakness.  Ultrasound left shoulder:  Intact biceps tendon. Noted on LAX and SAX  Intact rotator cuff tendons with no hypoechoic change Note infraspinatus looks relatively diminished in size Posterior labrum appears intact Oblique vies to try to assess anterior labrum shows a calcified loose body in the [facility] joint space ~ 3 to 5 mm in length  Impression: Shoulder pain probably secondary to anterior labral tear with resultant loose body  Ultrasound and interpretation by Sibyl Parr. Fields, MD   Procedure: Left subacromial injection After  informed written consent timeout was performed, patient was seated in chair in exam room. The patient's Left shoulder was prepped with Betadine and alcohol swab and utilizing posterolateral approach. The patient's subacromial space was injected with 4:1 lidocaine: depomedrol. Patient tolerated the procedure well without immediate  complications. - Procedure was performed by Sports Medicine Fellow Madelyn Brunner, DO   ASSESSMENT & PLAN:   Katherine Wolfe is a 55 year old Caucasian female with past medical history of Ehlers-Danlos syndrome hypermobility subtype, right biceps tendon repair, left IT band syndrome, spectated labral injury left hip, who presents to clinic for persistent left shoulder, left neck and left hip pain.   Trapezius muscle strain: Plan to continue with conservative management.  Patient has prescription for Robaxin though is hesitant to take them.  Left anterior Labrum Tear Left subacromial Bursitis: Anterior labrum tear and subacromial bursitis identified on ultrasound of left shoulder.  We will plan for corticosteroid shot today.  Continue conservative management.  Can continue to exercise limiting movements that exacerbate pain.  Left hip pain Suspected labral Injury of L Hip: FADIR test positive with clinical presentation concerning for labral injury of left hip.  As previously had corticosteroid injection done at outside facility which improved symptoms.  We will schedule follow-up in 6 weeks for diagnostic ultrasound left hip with cortisone injection.  Portions of this report may have been transcribed using voice recognition software. Every effort was made to ensure accuracy; however, inadvertent computerized transcription errors may be present.   [name], MD 09/06/21,  3:00 PM Pager: [phone] Internal Medicine Resident, PGY-1 [facility]   I observed and examined the patient with the resident and agree with assessment and plan.  Note reviewed and modified by me.

## 2021-09-06 NOTE — Assessment & Plan Note (Signed)
Suspect there is a partial tear of the anterior labrum based on exam On [place] there appears to be a loose body in the [facility] joint that could be a fragment of labrum  Considering her EDS we will try to see if this resolves with conservative care CSI today Exercises for scapular stability Continue Pilates  Reck 4 to 6 weeks

## 2021-09-22 NOTE — Assessment & Plan Note (Signed)
With her HM we suspect a positional injury  Plan Pilates RX with stabilization of shoulder MM

## 2021-10-18 ENCOUNTER — Ambulatory Visit: Payer: Self-pay

## 2021-10-18 ENCOUNTER — Ambulatory Visit (INDEPENDENT_AMBULATORY_CARE_PROVIDER_SITE_OTHER): Payer: Managed Care, Other (non HMO) | Admitting: Sports Medicine

## 2021-10-18 VITALS — BP 108/70 | Ht 64.0 in | Wt 126.0 lb

## 2021-10-18 DIAGNOSIS — M25512 Pain in left shoulder: Secondary | ICD-10-CM | POA: Diagnosis not present

## 2021-10-18 DIAGNOSIS — S73192D Other sprain of left hip, subsequent encounter: Secondary | ICD-10-CM

## 2021-10-18 DIAGNOSIS — Q7962 Hypermobile Ehlers-Danlos syndrome: Secondary | ICD-10-CM

## 2021-10-18 DIAGNOSIS — M25552 Pain in left hip: Secondary | ICD-10-CM

## 2021-10-18 DIAGNOSIS — G8929 Other chronic pain: Secondary | ICD-10-CM

## 2021-10-18 MED ORDER — METHYLPREDNISOLONE ACETATE 40 MG/ML IJ SUSP
40.0000 mg | Freq: Once | INTRAMUSCULAR | Status: AC
  Administered 2021-10-18: 40 mg via INTRA_ARTICULAR

## 2021-10-18 NOTE — Progress Notes (Signed)
PCP: Gweneth Dimitri, MD  Subjective:   HPI: Patient is a 56 y.o. female here for follow-up follow-up of left shoulder pain and evaluation of left hip pain today.  Of note patient does have medical history of Ehlers-Danlos syndrome hypermobility subtype.  Left shoulder pain - no better.  Did not feel like injection helped her.  She has been doing home exercises, and is able to do them but does have pain following the exercises.  At this point she feels she is reliant on Voltaren gel and ibuprofen to get through the day.  She has pain in the anterior posterior and lateral aspect of the shoulder.  She will get some clicking associated.  Denies any subluxation or dislocation.  Does report she had surgery on the right shoulder back in 2016/2017 with Dr. Dion Saucier.  Left hip pain - hip pain continues in the anterior hip.  Does sometimes have radiation into the groin.  Sometimes some catching.  Takes ibuprofen as needed.  She gets pain with flexion of the hip.  She has been doing some home exercises for hip strengthening at the gym and does feel like this does help with her pain and function.  She gets pain with sleeping on that side. Recall that injury happened when she fell/stepped into a deep hole with the left leg back in 2016, has not been normal since that time per patient.  Past Medical History:  Diagnosis Date   Anorexia    in high school, none since   Asthma    well controlled   Cervical dysplasia    and shoulder pain   Dry eye    Knee pain, bilateral 11-12   SEES DR. FIELDS   Migraine    Rosacea     Current Outpatient Medications on File Prior to Visit  Medication Sig Dispense Refill   adapalene (DIFFERIN) 0.1 % gel Apply 1 application topically at bedtime.     Calcium Carbonate-Vitamin D (CALCIUM + D PO) Take 1 tablet by mouth daily.      co-enzyme Q-10 30 MG capsule Take 30 mg by mouth 3 (three) times daily.       doxycycline (PERIOSTAT) 20 MG tablet Take 20 mg by mouth 2 (two)  times daily.  3   EPINEPHrine 0.3 mg/0.3 mL IJ SOAJ injection Inject 0.3 mg into the muscle as needed for anaphylaxis.     fish oil-omega-3 fatty acids 1000 MG capsule Take 2 g by mouth daily.       Magnesium 250 MG TABS Take 250 mg by mouth daily.     meloxicam (MOBIC) 7.5 MG tablet take 1 to 2 tablets by mouth once daily (Patient taking differently: Take by mouth daily as needed. ) 60 tablet 11   metroNIDAZOLE (METROCREAM) 0.75 % cream Apply 1 application topically daily.     montelukast (SINGULAIR) 10 MG tablet Take 1 tablet (10 mg total) by mouth at bedtime. 30 tablet 0   Multiple Vitamin (MULTIVITAMIN) capsule Take 1 capsule by mouth daily.       predniSONE (DELTASONE) 20 MG tablet Take 20 mg by mouth daily as needed. Taking prn for allergies     Tea Tree 100 % OIL Apply 1 application topically 2 (two) times daily.     UNABLE TO FIND Apply 1 application topically 2 (two) times daily. undecyclenic acid 25%     VITAMIN D, ERGOCALCIFEROL, PO Take by mouth. 1800 IU DAILY      VOLTAREN 1 % GEL Apply 2  g topically daily as needed (pain).      [DISCONTINUED] FLUoxetine (PROZAC) 20 MG tablet Take 1 tablet (20 mg total) by mouth daily. 30 tablet 3   No current facility-administered medications on file prior to visit.    Past Surgical History:  Procedure Laterality Date   DILATION AND CURETTAGE OF UTERUS     GYNECOLOGIC CRYOSURGERY     HYSTEROSCOPY  01/2008   D & C   KNEE SURGERY     Arthroscopic   PELVIC LAPAROSCOPY  09/1999   right ovarian fibroma   RHINOPLASTY     ROTATOR CUFF REPAIR      Allergies  Allergen Reactions   Other Swelling    TREE NUTS; WASPS, HORNETS AND YELLOW JACKETS    BP 108/70    Ht 5\' 4"  (1.626 m)    Wt 126 lb (57.2 kg)    LMP 10/31/2017    BMI 21.63 kg/m   [facility] 07/21/2021 09/06/2021 10/18/2021  Frequency of aerobic exercise (# of days/week) 4 6 6   Average time in minutes 45 30 30  Frequency of strengthening activities (# of  days/week) 4 5 5     No flowsheet data found.      Objective:  Physical Exam:  Gen: Well-appearing, in no acute distress; non-toxic.  Hypermobile throughout. CV: Regular Rate. Well-perfused. Warm.  Resp: Breathing unlabored on room air; no wheezing. Psych: Fluid speech in conversation; appropriate affect; normal thought process Neuro: Sensation intact throughout. No gross coordination deficits.  MSK:  - Left shoulder: There is some TTP over the posterior acromion, just distal to the coracoid, as well as in the left trapezius muscle belly.  There is hypertonicity of the left trapezius muscle.  There is no erythema, effusion or swelling.  There is full range of motion in flexion, abduction, internal/external rotation.  Mild pain with resisted empty can and resisted external rotation.  Mildly positive sulcus sign as she is able to be translated somewhat further in the anterior and posterior direction, although no subluxation noted  - Left hip: Inspection of the left hip does not demonstrate any erythema, ecchymosis or swelling.  There is full range of motion about the hip.  Positive Pearlean BrownieFaber and FADIR testing today; positive TTP just distal to the AIIS and in the proximal portion of the hip abductors.  Strength 5/5 throughout.  Neurovascular intact distally.  MSK Limited hip ultrasound performed, left   -Evaluation of the ASIS was identified in short axis without bony irregularity or pathology, proper insertion of the sartorius muscle without tendon irregularity was noted. - Evaluation of the AIIS in both short and long axis was identified with cortical irregularity and bony spurring.  There is evidence of tendinopathic changes of the rectus femoris near the insertion site at this area.  Tendon appears grossly intact without tearing. -Femoral head and neck junction was identified without bursal distention, well-rounded femoral head without irregularity. -Further evaluation of the medial hip joint  and acetabulum/labrum demonstrates irregularity with some isoechoic changes of the hip labrum, with likely fraying noted.  There are no loose bodies identified nor joint effusion noted.  IMPRESSION: Cortical irregularity with some bony spurs off the AIIS with tendinopathic changes of the rectus femoris.  Evidence of degenerative labral tearing/fraying of the left hip.    Assessment & Plan:  1. Left hip pain - evidence of cortical irregularity of AIIS and tendinopathic changes of insertional rectus femoris 2. Degenerative labral tear, left hip 3. Left shoulder  pain with labral tear and calcified loose body (3-38mm) in GHJ space 4. Ehlers-Danlos, hypermobility type  Procedure: Rectus femoris tendon sheath injection, left hip After informed written consent timeout was performed, patient was lying in supine position on exam table. The patient's left hip was prepped with alcohol swabs and sterile ultrasound gel. Utilizing an in-plane approach, a 22-gauge, 3.5" needle was inserted under ultrasound guidance into the insertion of the rectus femoris tendon on the AIIS. This area was injected with a 4:1 lidocaine:depomedrol injectate. Patient tolerated the procedure well without immediate complications.  Plan: - CS injection into left AIIS/rectus femoris tendon sheath origin. If she gets relief from this, she may continue HEP. If no improvement, may consider IA-hip injection at next visit. - continue HEP for shoulder and left hip - continue voltaren gel and ibuprofen PRN - at this point for the shoulder, she has failed conservative management including injection. We will have her f/u with Dr. Dion Saucier to discuss surgical treatment options, which may include removal of loose body within GHJ - f/u in 6 weeks for left hip  Madelyn Brunner, DO PGY-4, Sports Medicine Fellow [facility]  I observed and examined the patient with the [facility] resident and agree with assessment and plan.  Note  reviewed and modified by me. Vedia Coffer

## 2021-10-19 ENCOUNTER — Other Ambulatory Visit: Payer: Managed Care, Other (non HMO) | Admitting: Sports Medicine

## 2022-01-09 DIAGNOSIS — J301 Allergic rhinitis due to pollen: Secondary | ICD-10-CM | POA: Diagnosis not present

## 2022-01-09 DIAGNOSIS — T7805XD Anaphylactic reaction due to tree nuts and seeds, subsequent encounter: Secondary | ICD-10-CM | POA: Diagnosis not present

## 2022-01-09 DIAGNOSIS — Z13220 Encounter for screening for lipoid disorders: Secondary | ICD-10-CM | POA: Diagnosis not present

## 2022-01-09 DIAGNOSIS — J4530 Mild persistent asthma, uncomplicated: Secondary | ICD-10-CM | POA: Diagnosis not present

## 2022-01-09 DIAGNOSIS — M85851 Other specified disorders of bone density and structure, right thigh: Secondary | ICD-10-CM | POA: Diagnosis not present

## 2022-01-09 DIAGNOSIS — Z131 Encounter for screening for diabetes mellitus: Secondary | ICD-10-CM | POA: Diagnosis not present

## 2022-01-09 DIAGNOSIS — H1013 Acute atopic conjunctivitis, bilateral: Secondary | ICD-10-CM | POA: Diagnosis not present

## 2022-01-09 DIAGNOSIS — Z79899 Other long term (current) drug therapy: Secondary | ICD-10-CM | POA: Diagnosis not present

## 2022-01-09 DIAGNOSIS — Z0000 Encounter for general adult medical examination without abnormal findings: Secondary | ICD-10-CM | POA: Diagnosis not present

## 2022-01-09 DIAGNOSIS — Z23 Encounter for immunization: Secondary | ICD-10-CM | POA: Diagnosis not present

## 2022-01-10 DIAGNOSIS — M25512 Pain in left shoulder: Secondary | ICD-10-CM | POA: Diagnosis not present

## 2022-01-10 DIAGNOSIS — S46012D Strain of muscle(s) and tendon(s) of the rotator cuff of left shoulder, subsequent encounter: Secondary | ICD-10-CM | POA: Diagnosis not present

## 2022-01-24 DIAGNOSIS — M25512 Pain in left shoulder: Secondary | ICD-10-CM | POA: Diagnosis not present

## 2022-01-24 DIAGNOSIS — S46012D Strain of muscle(s) and tendon(s) of the rotator cuff of left shoulder, subsequent encounter: Secondary | ICD-10-CM | POA: Diagnosis not present

## 2022-01-30 DIAGNOSIS — M25512 Pain in left shoulder: Secondary | ICD-10-CM | POA: Diagnosis not present

## 2022-04-24 DIAGNOSIS — S30861A Insect bite (nonvenomous) of abdominal wall, initial encounter: Secondary | ICD-10-CM | POA: Diagnosis not present

## 2022-04-24 DIAGNOSIS — X58XXXA Exposure to other specified factors, initial encounter: Secondary | ICD-10-CM | POA: Diagnosis not present

## 2022-04-24 DIAGNOSIS — Y999 Unspecified external cause status: Secondary | ICD-10-CM | POA: Diagnosis not present

## 2022-05-02 DIAGNOSIS — L219 Seborrheic dermatitis, unspecified: Secondary | ICD-10-CM | POA: Diagnosis not present

## 2022-05-02 DIAGNOSIS — D225 Melanocytic nevi of trunk: Secondary | ICD-10-CM | POA: Diagnosis not present

## 2022-05-02 DIAGNOSIS — L578 Other skin changes due to chronic exposure to nonionizing radiation: Secondary | ICD-10-CM | POA: Diagnosis not present

## 2022-05-02 DIAGNOSIS — L719 Rosacea, unspecified: Secondary | ICD-10-CM | POA: Diagnosis not present

## 2022-05-11 DIAGNOSIS — Z1231 Encounter for screening mammogram for malignant neoplasm of breast: Secondary | ICD-10-CM | POA: Diagnosis not present

## 2022-07-13 DIAGNOSIS — K625 Hemorrhage of anus and rectum: Secondary | ICD-10-CM | POA: Diagnosis not present

## 2022-07-13 DIAGNOSIS — Z6822 Body mass index (BMI) 22.0-22.9, adult: Secondary | ICD-10-CM | POA: Diagnosis not present

## 2022-07-13 DIAGNOSIS — K648 Other hemorrhoids: Secondary | ICD-10-CM | POA: Diagnosis not present

## 2022-07-13 DIAGNOSIS — R002 Palpitations: Secondary | ICD-10-CM | POA: Diagnosis not present

## 2022-07-21 DIAGNOSIS — Z23 Encounter for immunization: Secondary | ICD-10-CM | POA: Diagnosis not present

## 2022-07-24 DIAGNOSIS — H524 Presbyopia: Secondary | ICD-10-CM | POA: Diagnosis not present

## 2022-07-24 DIAGNOSIS — H04123 Dry eye syndrome of bilateral lacrimal glands: Secondary | ICD-10-CM | POA: Diagnosis not present

## 2022-12-19 DIAGNOSIS — L570 Actinic keratosis: Secondary | ICD-10-CM | POA: Diagnosis not present

## 2022-12-21 ENCOUNTER — Ambulatory Visit (INDEPENDENT_AMBULATORY_CARE_PROVIDER_SITE_OTHER): Payer: BC Managed Care – PPO | Admitting: Sports Medicine

## 2022-12-21 ENCOUNTER — Other Ambulatory Visit: Payer: Self-pay

## 2022-12-21 VITALS — BP 118/78 | Ht 64.0 in | Wt 126.4 lb

## 2022-12-21 DIAGNOSIS — M719 Bursopathy, unspecified: Secondary | ICD-10-CM | POA: Insufficient documentation

## 2022-12-21 DIAGNOSIS — M7051 Other bursitis of knee, right knee: Secondary | ICD-10-CM | POA: Diagnosis not present

## 2022-12-21 DIAGNOSIS — M25561 Pain in right knee: Secondary | ICD-10-CM

## 2022-12-21 NOTE — Assessment & Plan Note (Signed)
Ultrasound performed in office which showed pockets of fluid within the semimembranosus and gastrocnemius consistent with a semimembranosus gastrocnemius bursitis.  Discussed that patient should continue compression, ice massage, exercises to help with strengthening gastroc and semimembranosus including leg curls etc. -Compression, ice massage, NSAIDs as needed -Follow-up in 6 weeks for repeat ultrasound

## 2022-12-21 NOTE — Progress Notes (Signed)
    SUBJECTIVE:   CHIEF COMPLAINT / HPI: Right knee pain  2 months ago started having posterior right knee pain Does feel sharp at times if she steps in the wrong direction She is unsure whether she injured her leg during a weighted leg curl machine use or during hyperextension during a Pilates session.  This time she had felt a pull on the back of her leg. The pain that she has is worsened with over extending  She says that sometimes it feels like her knee clicks or "rattles" She does use a knee sleeve from time to time which seems to be helping Stationary bikes helping the pain as well Elliptical the pain worse Only using ibuprofen currently and Voltaren gel Currently feels calmed down from a few weeks ago Has been icing it  PERTINENT  PMH / PSH: Benign hypermobility syndrome  OBJECTIVE:   BP 118/78   Ht 5\' 4"  (1.626 m)   Wt 126 lb 6.4 oz (57.3 kg)   LMP 10/31/2017   BMI 21.70 kg/m   Gen: NAD, awake, alert, responsive to questions Knee Exam -Inspection: no deformity, no discoloration, small ecchymosis above right patella, right popliteal fossa full in comparison to left -Palpation: no joint line tenderness, tenderness to palpation along medial aspect of popliteal fossa on right -ROM: Extension: 0 degrees; Flexion: 170 degrees.  (can get heel to buttocks) -Patient able to extend and flex his knee on his own, strength 5/5 b/l -Limb neurovascularly intact, no instability noted   Ultrasound of Right Knee No suprapatellar pouch effusion Medial and lateral meniscus look intact and good joint space noted Normal patellar and quadriceps tendons In posterior knee there is hypoechoic change beneath the medial gastrocnemius tendon near proximal insertion.  There is a larger hypoechoic pocket beneath the semimebranosus tendon No true Baker's cyst Tendons intact Post menicus looks normal  Impression: Semimembranosus- gastrocnemius bursitis with classical US findings  Ultrasound and  interpretation by Wolfgang Phoenix. Fields, MD   ASSESSMENT/PLAN:   Bursitis Ultrasound performed in office which showed pockets of fluid within the semimembranosus and gastrocnemius consistent with a semimembranosus gastrocnemius bursitis.  Discussed that patient should continue compression, ice massage, exercises to help with strengthening gastroc and semimembranosus including leg curls etc. -Compression, ice massage, NSAIDs as needed -Follow-up in 6 weeks for repeat ultrasound  Gerrit Heck, MD [place]   I observed and examined the patient with the resident and agree with assessment and plan.  Note reviewed and modified by me. Ila Mcgill, MD

## 2023-02-13 ENCOUNTER — Other Ambulatory Visit: Payer: Self-pay

## 2023-02-13 ENCOUNTER — Ambulatory Visit (INDEPENDENT_AMBULATORY_CARE_PROVIDER_SITE_OTHER): Payer: BC Managed Care – PPO | Admitting: Sports Medicine

## 2023-02-13 VITALS — BP 100/64 | Ht 64.0 in | Wt 126.0 lb

## 2023-02-13 DIAGNOSIS — M25561 Pain in right knee: Secondary | ICD-10-CM

## 2023-02-13 NOTE — Progress Notes (Unsigned)
   Established Patient Office Visit  Subjective   Patient ID: Katherine Wolfe, female    DOB: [birth year]  Age: 57 y.o. MRN: [id number]  F/u R knee.  She is here today for follow-up on her right knee semimembranosus/gastroc bursitis.  She reports she was having quite a bit of improvement until last Thursday when she went to dance class.  She did not have any injury during dance class and she was able to complete the class however she woke up Friday with similar pain in her knee that worsened on Saturday.  She has slowly had improvement throughout the weekend with some ibuprofen.    ROS as listed above in HPI    Objective:     BP 100/64   Ht 5\' 4"  (1.626 m)   Wt 126 lb (57.2 kg)   LMP 10/31/2017   BMI 21.63 kg/m   Physical Exam Vitals reviewed.  Constitutional:      General: She is not in acute distress.    Appearance: Normal appearance. She is not ill-appearing or toxic-appearing.  Pulmonary:     Effort: Pulmonary effort is normal.  Neurological:     Mental Status: She is alert.   Right knee: No obvious deformity or asymmetry.  No ecchymosis or edema or effusion.  Full range of motion from 0-110.  Strength 5/5 resisted knee flexion and extension.  Stable to varus and valgus stress.  No tenderness to palpation along the medial or lateral joint line.  Some tenderness to palpation in the posterior knee along the medial aspect normal gait..  Limited ultrasound: Right knee No hypoechoic fluid seen in the suprapatellar pouch On the exam of the posterior knee there is a large compressible hypoechoic structure following the medial hamstring tendon.  No neovessels seen on Doppler flow.  Posterior meniscus was seen, no obvious hypoechoic changes or hypoechoic fluid surrounding the area.  Ultrasound and interpretation by Dr. Leonor Liv amd Sibyl Parr. Fields, MD     Assessment & Plan:   Problem List Items Addressed This Visit       Other   Acute pain of right knee - Primary     Patient has clinical and radiographical, ultrasound evidence of medial hamstring, semimembranosus and medial gastrocnemius bursitis.  This was found on previous exam of her she had an exacerbation after going dancing last week.  Recommend she continue with previous interventions, ice, compression and gentle range of motion exercises.  She can follow up with [place] in 6 weeks if her pain persists or does not improve.  If she continues to have improvement we will see her back in 3 months.      Relevant Orders   [place] LIMITED JOINT SPACE STRUCTURES LOW RIGHT    Return in about 6 weeks (around 03/27/2023).    Claudie Leach, DO  I observed and examined the patient with the resident and agree with assessment and plan.  Note reviewed and modified by me. Sterling Big, MD

## 2023-02-13 NOTE — Assessment & Plan Note (Signed)
Patient has clinical and radiographical, ultrasound evidence of medial hamstring, semimembranosus and medial gastrocnemius bursitis.  This was found on previous exam of her she had an exacerbation after going dancing last week.  Recommend she continue with previous interventions, ice, compression and gentle range of motion exercises.  She can follow up with [place] in 6 weeks if her pain persists or does not improve.  If she continues to have improvement we will see her back in 3 months.

## 2023-02-21 DIAGNOSIS — T7805XD Anaphylactic reaction due to tree nuts and seeds, subsequent encounter: Secondary | ICD-10-CM | POA: Diagnosis not present

## 2023-02-21 DIAGNOSIS — J4530 Mild persistent asthma, uncomplicated: Secondary | ICD-10-CM | POA: Diagnosis not present

## 2023-02-21 DIAGNOSIS — Z0000 Encounter for general adult medical examination without abnormal findings: Secondary | ICD-10-CM | POA: Diagnosis not present

## 2023-02-21 DIAGNOSIS — M810 Age-related osteoporosis without current pathological fracture: Secondary | ICD-10-CM | POA: Diagnosis not present

## 2023-03-12 DIAGNOSIS — N951 Menopausal and female climacteric states: Secondary | ICD-10-CM | POA: Diagnosis not present

## 2023-03-12 DIAGNOSIS — M810 Age-related osteoporosis without current pathological fracture: Secondary | ICD-10-CM | POA: Diagnosis not present

## 2023-05-08 DIAGNOSIS — D225 Melanocytic nevi of trunk: Secondary | ICD-10-CM | POA: Diagnosis not present

## 2023-05-08 DIAGNOSIS — L578 Other skin changes due to chronic exposure to nonionizing radiation: Secondary | ICD-10-CM | POA: Diagnosis not present

## 2023-05-08 DIAGNOSIS — L821 Other seborrheic keratosis: Secondary | ICD-10-CM | POA: Diagnosis not present

## 2023-05-08 DIAGNOSIS — L719 Rosacea, unspecified: Secondary | ICD-10-CM | POA: Diagnosis not present

## 2023-05-17 DIAGNOSIS — Z1231 Encounter for screening mammogram for malignant neoplasm of breast: Secondary | ICD-10-CM | POA: Diagnosis not present

## 2023-07-04 DIAGNOSIS — Z23 Encounter for immunization: Secondary | ICD-10-CM | POA: Diagnosis not present

## 2023-07-31 DIAGNOSIS — H25012 Cortical age-related cataract, left eye: Secondary | ICD-10-CM | POA: Diagnosis not present

## 2023-07-31 DIAGNOSIS — H04123 Dry eye syndrome of bilateral lacrimal glands: Secondary | ICD-10-CM | POA: Diagnosis not present

## 2023-09-28 DIAGNOSIS — J0100 Acute maxillary sinusitis, unspecified: Secondary | ICD-10-CM | POA: Diagnosis not present

## 2023-10-15 DIAGNOSIS — H6122 Impacted cerumen, left ear: Secondary | ICD-10-CM | POA: Diagnosis not present

## 2023-10-15 DIAGNOSIS — J324 Chronic pansinusitis: Secondary | ICD-10-CM | POA: Diagnosis not present

## 2023-10-30 DIAGNOSIS — J329 Chronic sinusitis, unspecified: Secondary | ICD-10-CM | POA: Diagnosis not present

## 2023-10-30 DIAGNOSIS — J342 Deviated nasal septum: Secondary | ICD-10-CM | POA: Diagnosis not present

## 2023-11-01 DIAGNOSIS — M26629 Arthralgia of temporomandibular joint, unspecified side: Secondary | ICD-10-CM | POA: Diagnosis not present

## 2024-02-07 DIAGNOSIS — R21 Rash and other nonspecific skin eruption: Secondary | ICD-10-CM | POA: Diagnosis not present

## 2024-02-07 DIAGNOSIS — W57XXXA Bitten or stung by nonvenomous insect and other nonvenomous arthropods, initial encounter: Secondary | ICD-10-CM | POA: Diagnosis not present

## 2024-02-21 DIAGNOSIS — M810 Age-related osteoporosis without current pathological fracture: Secondary | ICD-10-CM | POA: Diagnosis not present

## 2024-02-21 DIAGNOSIS — Z13220 Encounter for screening for lipoid disorders: Secondary | ICD-10-CM | POA: Diagnosis not present

## 2024-03-10 DIAGNOSIS — T7805XD Anaphylactic reaction due to tree nuts and seeds, subsequent encounter: Secondary | ICD-10-CM | POA: Diagnosis not present

## 2024-03-10 DIAGNOSIS — M810 Age-related osteoporosis without current pathological fracture: Secondary | ICD-10-CM | POA: Diagnosis not present

## 2024-03-10 DIAGNOSIS — J4520 Mild intermittent asthma, uncomplicated: Secondary | ICD-10-CM | POA: Diagnosis not present

## 2024-03-10 DIAGNOSIS — Z124 Encounter for screening for malignant neoplasm of cervix: Secondary | ICD-10-CM | POA: Diagnosis not present

## 2024-03-10 DIAGNOSIS — Z0000 Encounter for general adult medical examination without abnormal findings: Secondary | ICD-10-CM | POA: Diagnosis not present

## 2024-03-10 DIAGNOSIS — Z1389 Encounter for screening for other disorder: Secondary | ICD-10-CM | POA: Diagnosis not present

## 2024-04-12 DIAGNOSIS — S93105A Unspecified dislocation of left toe(s), initial encounter: Secondary | ICD-10-CM | POA: Diagnosis not present

## 2024-04-12 DIAGNOSIS — W2209XA Striking against other stationary object, initial encounter: Secondary | ICD-10-CM | POA: Diagnosis not present

## 2024-04-12 DIAGNOSIS — S99922A Unspecified injury of left foot, initial encounter: Secondary | ICD-10-CM | POA: Diagnosis not present

## 2024-04-12 DIAGNOSIS — S92512A Displaced fracture of proximal phalanx of left lesser toe(s), initial encounter for closed fracture: Secondary | ICD-10-CM | POA: Diagnosis not present

## 2024-04-14 DIAGNOSIS — M79675 Pain in left toe(s): Secondary | ICD-10-CM | POA: Diagnosis not present
